# Patient Record
Sex: Male | Born: 1960 | Race: White | Hispanic: No | Marital: Married | Smoking: Never smoker
Health system: Southern US, Community
[De-identification: ages and names within clinical notes are randomized; demographics above are authoritative.]

## PROBLEM LIST (undated history)

## (undated) DIAGNOSIS — Z86010 Personal history of colonic polyps: Secondary | ICD-10-CM

## (undated) DIAGNOSIS — E785 Hyperlipidemia, unspecified: Secondary | ICD-10-CM

## (undated) DIAGNOSIS — I10 Essential (primary) hypertension: Secondary | ICD-10-CM

## (undated) DIAGNOSIS — N529 Male erectile dysfunction, unspecified: Secondary | ICD-10-CM

## (undated) HISTORY — PX: KNEE ARTHROSCOPY: SUR90

## (undated) HISTORY — DX: Male erectile dysfunction, unspecified: N52.9

## (undated) HISTORY — DX: Essential (primary) hypertension: I10

## (undated) HISTORY — DX: Personal history of colonic polyps: Z86.010

## (undated) HISTORY — DX: Hyperlipidemia, unspecified: E78.5

## (undated) HISTORY — PX: COLONOSCOPY: SHX174

---

## 1997-12-29 ENCOUNTER — Encounter: Payer: Self-pay | Admitting: Internal Medicine

## 2004-08-15 ENCOUNTER — Encounter: Payer: Self-pay | Admitting: Internal Medicine

## 2004-08-15 ENCOUNTER — Ambulatory Visit: Payer: Self-pay | Admitting: Internal Medicine

## 2004-08-20 ENCOUNTER — Ambulatory Visit: Payer: Self-pay | Admitting: Internal Medicine

## 2004-09-21 ENCOUNTER — Ambulatory Visit: Payer: Self-pay | Admitting: Internal Medicine

## 2004-12-03 DIAGNOSIS — Z86010 Personal history of colonic polyps: Secondary | ICD-10-CM

## 2004-12-03 DIAGNOSIS — Z860101 Personal history of adenomatous and serrated colon polyps: Secondary | ICD-10-CM

## 2004-12-03 HISTORY — DX: Personal history of colonic polyps: Z86.010

## 2004-12-03 HISTORY — DX: Personal history of adenomatous and serrated colon polyps: Z86.0101

## 2005-09-06 ENCOUNTER — Ambulatory Visit: Payer: Self-pay | Admitting: Internal Medicine

## 2007-01-05 ENCOUNTER — Ambulatory Visit: Payer: Self-pay | Admitting: Internal Medicine

## 2007-01-05 LAB — CONVERTED CEMR LAB
ALT: 23 units/L (ref 0–40)
Albumin: 3.9 g/dL (ref 3.5–5.2)
BUN: 10 mg/dL (ref 6–23)
Basophils Absolute: 0 10*3/uL (ref 0.0–0.1)
Basophils Relative: 0.7 % (ref 0.0–1.0)
Cholesterol: 212 mg/dL (ref 0–200)
Creatinine, Ser: 1 mg/dL (ref 0.4–1.5)
Direct LDL: 131.1 mg/dL
Eosinophils Absolute: 0.1 10*3/uL (ref 0.0–0.6)
Eosinophils Relative: 2 % (ref 0.0–5.0)
GFR calc Af Amer: 103 mL/min
HDL: 46.5 mg/dL (ref >39.0)
Leukocytes, UA: NEGATIVE
MCHC: 34.1 g/dL (ref 30.0–36.0)
MCV: 86.2 fL (ref 78.0–100.0)
Monocytes Absolute: 0.5 10*3/uL (ref 0.2–0.7)
Neutro Abs: 3.1 10*3/uL (ref 1.4–7.7)
Neutrophils Relative %: 52.2 % (ref 43.0–77.0)
PSA: 0.7 ng/mL (ref 0.10–4.00)
Platelets: 272 10*3/uL (ref 150–400)
Potassium: 4.8 meq/L (ref 3.5–5.1)
RDW: 12.2 % (ref 11.5–14.6)
Specific Gravity, Urine: 1.025 (ref 1.000–1.03)
Total CHOL/HDL Ratio: 4.6
Total Protein, Urine: NEGATIVE mg/dL
Total Protein: 7 g/dL (ref 6.0–8.3)
Urobilinogen, UA: 0.2 (ref 0.0–1.0)
VLDL: 15 mg/dL (ref 0–40)

## 2007-03-12 ENCOUNTER — Ambulatory Visit: Payer: Self-pay | Admitting: Internal Medicine

## 2007-03-12 DIAGNOSIS — I10 Essential (primary) hypertension: Secondary | ICD-10-CM

## 2007-03-12 DIAGNOSIS — E785 Hyperlipidemia, unspecified: Secondary | ICD-10-CM

## 2008-04-28 ENCOUNTER — Telehealth: Payer: Self-pay | Admitting: Internal Medicine

## 2008-05-16 ENCOUNTER — Ambulatory Visit: Payer: Self-pay | Admitting: Internal Medicine

## 2008-05-17 LAB — CONVERTED CEMR LAB
Chloride: 104 meq/L (ref 96–112)
GFR calc Af Amer: 103 mL/min
Sodium: 141 meq/L (ref 135–145)

## 2008-11-15 ENCOUNTER — Ambulatory Visit: Payer: Self-pay | Admitting: Internal Medicine

## 2008-11-15 DIAGNOSIS — N529 Male erectile dysfunction, unspecified: Secondary | ICD-10-CM

## 2008-12-28 ENCOUNTER — Ambulatory Visit: Payer: Self-pay | Admitting: Internal Medicine

## 2008-12-29 LAB — CONVERTED CEMR LAB
BUN: 12 mg/dL (ref 6–23)
Basophils Absolute: 0 10*3/uL (ref 0.0–0.1)
Basophils Relative: 0.3 % (ref 0.0–3.0)
CO2: 31 meq/L (ref 19–32)
Calcium: 9.1 mg/dL (ref 8.4–10.5)
Chloride: 98 meq/L (ref 96–112)
Creatinine, Ser: 1 mg/dL (ref 0.4–1.5)
Eosinophils Absolute: 0.1 10*3/uL (ref 0.0–0.7)
Lymphocytes Relative: 35.5 % (ref 12.0–46.0)
MCHC: 34.3 g/dL (ref 30.0–36.0)
MCV: 88.5 fL (ref 78.0–100.0)
Monocytes Absolute: 0.4 10*3/uL (ref 0.1–1.0)
Monocytes Relative: 7.4 % (ref 3.0–12.0)
Neutro Abs: 3.3 10*3/uL (ref 1.4–7.7)
Potassium: 4.6 meq/L (ref 3.5–5.1)
TSH: 1.98 microintl units/mL (ref 0.35–5.50)
WBC: 5.9 10*3/uL (ref 4.5–10.5)

## 2009-05-22 ENCOUNTER — Ambulatory Visit: Payer: Self-pay | Admitting: Internal Medicine

## 2009-09-08 ENCOUNTER — Encounter (INDEPENDENT_AMBULATORY_CARE_PROVIDER_SITE_OTHER): Payer: Self-pay | Admitting: *Deleted

## 2009-11-16 ENCOUNTER — Telehealth: Payer: Self-pay | Admitting: Family Medicine

## 2009-11-20 ENCOUNTER — Ambulatory Visit: Payer: Self-pay | Admitting: Internal Medicine

## 2009-11-21 ENCOUNTER — Telehealth: Payer: Self-pay | Admitting: Internal Medicine

## 2009-11-21 ENCOUNTER — Encounter (INDEPENDENT_AMBULATORY_CARE_PROVIDER_SITE_OTHER): Payer: Self-pay | Admitting: *Deleted

## 2009-11-22 LAB — CONVERTED CEMR LAB
ALT: 19 units/L (ref 0–53)
AST: 19 units/L (ref 0–37)
Alkaline Phosphatase: 59 units/L (ref 39–117)
BUN: 12 mg/dL (ref 6–23)
Basophils Absolute: 0 10*3/uL (ref 0.0–0.1)
Basophils Relative: 0.3 % (ref 0.0–3.0)
CO2: 31 meq/L (ref 19–32)
Chloride: 97 meq/L (ref 96–112)
Eosinophils Relative: 0.6 % (ref 0.0–5.0)
Glucose, Bld: 88 mg/dL (ref 70–99)
HDL: 44.9 mg/dL (ref >39.00)
Neutro Abs: 4.6 10*3/uL (ref 1.4–7.7)
Phosphorus: 3.5 mg/dL (ref 2.3–4.6)
Total Bilirubin: 1 mg/dL (ref 0.3–1.2)
Total Protein: 7 g/dL (ref 6.0–8.3)
Triglycerides: 107 mg/dL (ref 0.0–149.0)

## 2009-11-30 ENCOUNTER — Encounter (INDEPENDENT_AMBULATORY_CARE_PROVIDER_SITE_OTHER): Payer: Self-pay | Admitting: *Deleted

## 2009-12-04 ENCOUNTER — Ambulatory Visit: Payer: Self-pay | Admitting: Internal Medicine

## 2009-12-21 ENCOUNTER — Ambulatory Visit: Payer: Self-pay | Admitting: Internal Medicine

## 2009-12-21 LAB — HM COLONOSCOPY

## 2009-12-31 ENCOUNTER — Emergency Department (HOSPITAL_COMMUNITY): Admission: EM | Admit: 2009-12-31 | Discharge: 2009-12-31 | Payer: Self-pay | Admitting: Family Medicine

## 2010-06-13 ENCOUNTER — Ambulatory Visit: Payer: Self-pay | Admitting: Internal Medicine

## 2010-08-14 NOTE — Procedures (Signed)
Summary: Colonoscopy  Patient: Alex Mueller Note: All result statuses are Final unless otherwise noted.  Tests: (1) Colonoscopy (COL)   COL Colonoscopy           DONE     [facility]     [address]     [place], [place]  [address]           COLONOSCOPY PROCEDURE REPORT           PATIENT:  Alex, Mueller  MR#:  [id number]     BIRTHDATE:  1961/01/24, 49 yrs. old  GENDER:  male     ENDOSCOPIST:  Iva Boop, MD, [facility]           PROCEDURE DATE:  12/21/2009     PROCEDURE:  Colonoscopy 19147     ASA CLASS:  Class II     INDICATIONS:  surveillance and high-risk screening, history of     pre-cancerous (adenomatous) colon polyps, family history of colon     cancer 8 mm adenoma removed 2006     father had colon cancer diagnosed in early 70's     MEDICATIONS:   Fentanyl 75 mcg IV, Versed 8 mg IV           DESCRIPTION OF PROCEDURE:   After the risks benefits and     alternatives of the procedure were thoroughly explained, informed     consent was obtained.  Digital rectal exam was performed and     revealed no abnormalities and normal prostate.   The [id number]     endoscope was introduced through the anus and advanced to the     cecum, which was identified by both the appendix and ileocecal     valve, without limitations.  The quality of the prep was     excellent, using MoviPrep.  The instrument was then slowly     withdrawn as the colon was fully examined.     Insertion: 3:39 minutes Withdrawal: 10:20 minutes     <<PROCEDUREIMAGES>>           FINDINGS:  Scattered diverticula were found in the sigmoid colon.     This was otherwise a normal examination of the colon. This includes     right colon retroflexion.   Retroflexed views in the rectum     revealed internal hemorrhoids.    The scope was then withdrawn     from the patient and the procedure completed.           COMPLICATIONS:  None     ENDOSCOPIC IMPRESSION:     1) Diverticula, scattered in the sigmoid colon   2) Otherwise normal examination of the colon, excellent prep     3) Internal hemorrhoids in the rectum     4) Personal history of adenoma removal 2006 and father had colon     cancer (70's)           REPEAT EXAM:  In 5 year(s) for routine screening/surveillance     colonocsopy.           Iva Boop, MD, [facility]           CC:  The Patient           n.     eSIGNED:   Iva Boop at 12/21/2009 11:08 AM           Asencion Noble, [phone]  Note: An exclamation mark (!) indicates a result that was not dispersed into  the flowsheet. Document Creation Date: 12/21/2009 11:08 AM _______________________________________________________________________  (1) Order result status: Final Collection or observation date-time: 12/21/2009 11:00 Requested date-time:  Receipt date-time:  Reported date-time:  Referring Physician:   Ordering Physician: Stan Head [phone]) Specimen Source:  Source: Launa Grill Order Number: [id number] Lab site:   Appended Document: Colonoscopy    Clinical Lists Changes  Observations: Added new observation of COLONNXTDUE: 12/2014 (12/21/2009 11:27)

## 2010-08-14 NOTE — Letter (Signed)
Summary: Previsit letter  [facility]  [address] [place], [place] [address]   Phone: [phone]  Fax: [phone]       11/21/2009 MRN: [id number]  [facility] [address] [place], [place]  [address]  Dear Mr. Blumer,  Welcome to the Gastroenterology Division at [facility].    You are scheduled to see a nurse for your pre-procedure visit on 12-07-09 at 9am on the 3rd floor at [facility], [address]  We ask that you try to arrive at our office 15 minutes prior to your appointment time to allow for check-in.  Your nurse visit will consist of discussing your medical and surgical history, your immediate family medical history, and your medications.    Please bring a complete list of all your medications or, if you prefer, bring the medication bottles and we will list them.  We will need to be aware of both prescribed and over the counter drugs.  We will need to know exact dosage information as well.  If you are on blood thinners (Coumadin, Plavix, Aggrenox, Ticlid, etc.) please call our office today/prior to your appointment, as we need to consult with your physician about holding your medication.   Please be prepared to read and sign documents such as consent forms, a financial agreement, and acknowledgement forms.  If necessary, and with your consent, a friend or relative is welcome to sit-in on the nurse visit with you.  Please bring your insurance card so that we may make a copy of it.  If your insurance requires a referral to see a specialist, please bring your referral form from your primary care physician.  No co-pay is required for this nurse visit.     If you cannot keep your appointment, please call [phone] to cancel or reschedule prior to your appointment date.  This allows [place] the opportunity to schedule an appointment for another patient in need of care.    Thank you for choosing [facility] for your medical needs.  We  appreciate the opportunity to care for you.  Please visit [place] at our website  to learn more about our practice.                     Sincerely.                                                                                                                   The Gastroenterology Division

## 2010-08-14 NOTE — Assessment & Plan Note (Signed)
Summary: CPX/DLO   Vital Signs:  Patient profile:   50 year old male Weight:      233 pounds Temp:     98.3 degrees F oral Pulse rate:   64 / minute Pulse rhythm:   regular BP sitting:   112 / 60  (left arm) Cuff size:   large  Vitals Entered By: Mervin Hack CMA [facility]) (Nov 20, 2009 12:03 PM) CC: adult physical   History of Present Illness: Doing well No new concerns  Hasn't set up follow up colonoscopy It has been 5 years and he had a polyp    Allergies: 1)  ! Prinivil (Lisinopril)  Past History:  Past medical, surgical, family and social histories (including risk factors) reviewed for relevance to current acute and chronic problems.  Past Medical History: Reviewed history from 11/15/2008 and no changes required. Hypertension Hyperlipidemia Erectile dysfunction  Past Surgical History: Reviewed history from 03/12/2007 and no changes required. 1988 arthroscopy R knee 1998 arthroscopy L knee (Dr Priscille Kluver)  Family History: Reviewed history from 11/15/2008 and no changes required. Dad died of lung cancer @79 . Also had  bladder and colon cancer. CAD also Mom healthy 1 brother Dennie Bible uncle also with CAD No HTN  DM in Dad and Mat GM No prostate cancer  Social History: Reviewed history from 03/12/2007 and no changes required. 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 Eyes:  Denies double vision and vision loss-1 eye; needs reading glasses now. ENT:  Denies decreased hearing and ringing in ears; teeth okay--regular with dentist. CV:  Complains of lightheadness; denies chest pain or discomfort, difficulty breathing at night, difficulty breathing while lying down, fainting, and shortness of breath with exertion; gets some orthostatic dizziness when  first arising---goes away quickly. Resp:  Denies cough and shortness of breath. GI:  Denies abdominal pain, bloody stools, change in bowel habits, dark tarry stools, indigestion, nausea, and vomiting. GU:  Complains of erectile dysfunction; denies urinary frequency and urinary hesitancy; satisfied with viagra. Cialis was better discussed levitra-- $9 plan. MS:  Complains of joint pain; denies joint swelling; occ ankle pain. Derm:  Denies lesion(s) and rash. Neuro:  Denies headaches, numbness, and tingling. Psych:  Denies anxiety and depression. Heme:  Denies abnormal bruising and enlarge lymph nodes. Allergy:  Denies seasonal allergies and sneezing; had anaphylaxis with beesting in past and desensitization needs up dated epipen.  Physical Exam  General:  alert and normal appearance.   Eyes:  pupils equal, pupils round, pupils reactive to light, and no optic disk abnormalities.   Ears:  R ear normal and L ear normal.   Mouth:  no erythema and no lesions.   Neck:  supple, no masses, no thyromegaly, no carotid bruits, and no cervical lymphadenopathy.   Lungs:  normal respiratory effort and normal breath sounds.   Heart:  normal rate, regular rhythm, no murmur, and no gallop.   Abdomen:  soft, non-tender, and no masses.   Rectal:  no hemorrhoids and no masses.   Prostate:  no gland enlargement and no nodules.   Msk:  no joint tenderness and no joint swelling.   Pulses:  normal in feet Extremities:  no edema Neurologic:  alert & oriented X3, strength normal in all extremities, and gait normal.   Skin:  no rashes and no suspicious lesions.  Axillary Nodes:  No palpable lymphadenopathy Psych:  normally interactive, good eye contact, not anxious appearing, and not depressed appearing.     Impression & Recommendations:  Problem # 1:  PREVENTIVE HEALTH CARE (ICD-V70.0) Assessment Comment Only discussed fitness due for PSA needs appt for colon recall  Problem # 2:  HYPERTENSION  (ICD-401.9) Assessment: Unchanged  good control if orthostasis worsens, may need to decrease or try off meds  His updated medication list for this problem includes:    Hyzaar 100-25 Mg Tabs (Losartan potassium-hctz) .Marland Kitchen... 1 daily for high blood pressure  BP today: 112/60 Prior BP: 130/80 (05/22/2009)  Labs Reviewed: K+: 4.6 (12/28/2008) Creat: : 1.0 (12/28/2008)   Chol: 212 (01/05/2007)   HDL: 46.5 (01/05/2007)   LDL: DEL (01/05/2007)   TG: 73 (01/05/2007)  Orders: TLB-Renal Function Panel (80069-RENAL) TLB-CBC Platelet - w/Differential (85025-CBCD) TLB-TSH (Thyroid Stimulating Hormone) (84443-TSH) Venipuncture ([address])  Problem # 3:  HYPERLIPIDEMIA (ICD-272.4) Assessment: Comment Only  will recheck labs  Labs Reviewed: SGOT: 21 (01/05/2007)   SGPT: 23 (01/05/2007)   HDL:46.5 (01/05/2007)  LDL:DEL (01/05/2007)  Chol:212 (01/05/2007)  Trig:73 (01/05/2007)  Orders: TLB-Lipid Panel (80061-LIPID) TLB-Hepatic/Liver Function Pnl (80076-HEPATIC)  Complete Medication List: 1)  Adult Aspirin Low Strength 81 Mg Tbdp (Aspirin) .... Take 1 tablet by mouth once a day 2)  Viagra 100 Mg Tabs (Sildenafil citrate) .... 1/2 -1 about 30 minutes before sex as directed 3)  Hyzaar 100-25 Mg Tabs (Losartan potassium-hctz) .Marland Kitchen.. 1 daily for high blood pressure 4)  Levitra 20 Mg Tabs (Vardenafil hcl) .Marland Kitchen.. 1 tab about 30-60 minutes before sex 5)  Epipen 2-pak 0.3 Mg/0.41ml Devi (Epinephrine) .... Inject one for severe allergic reaction  Other Orders: TLB-PSA (Prostate Specific Antigen) (84153-PSA)  Patient Instructions: 1)  Please schedule a follow-up appointment in 6 months .  2)  Please call GI---he got recall letter for colonoscopy but never set it up Prescriptions: EPIPEN 2-PAK 0.3 MG/0.3ML DEVI (EPINEPHRINE) Inject one for severe allergic reaction  #1 x 1   Entered and Authorized by:   Cindee Salt MD   Signed by:   Cindee Salt MD on 11/20/2009   Method used:   Electronically  to        [facility]  [phone]* (retail)       [address]       [place], [place]  [address]       Ph: [phone] or [phone]       Fax: [phone]   RxID:   [id number] LEVITRA 20 MG TABS (VARDENAFIL HCL) 1 tab about 30-60 minutes before sex  #5 x 5   Entered and Authorized by:   Cindee Salt MD   Signed by:   Cindee Salt MD on 11/20/2009   Method used:   Electronically to        [facility]  [phone]* (retail)       [address]       [place], [place]  [address]       Ph: [phone] or [phone]       Fax: [phone]   RxID:   [id number] HYZAAR 100-25 MG TABS (LOSARTAN POTASSIUM-HCTZ) 1 daily for high blood pressure  #30 x 12   Entered by:   Mervin Hack CMA (AAMA)   Authorized by:   Cindee Salt MD   Signed by:   Mervin Hack CMA (  AAMA) on 11/20/2009   Method used:   [facility] [address] [phone]* (retail)       [address]       [place], [place]  [address]       Ph: [phone] or [phone]       Fax: [phone]   RxID:   [id number]   Current Allergies (reviewed today): ! PRINIVIL (LISINOPRIL)

## 2010-08-14 NOTE — Letter (Signed)
Summary: Colonoscopy Letter  [facility]  [address] [place], [place] [address]   Phone: [phone]  Fax: [phone]      September 08, 2009 MRN: [id number]   [facility] [address] [place], [place]  [address]   Dear Mr. [name],   According to your medical record, it is time for you to schedule a Colonoscopy. The American Cancer Society recommends this procedure as a method to detect early colon cancer. Patients with a family history of colon cancer, or a personal history of colon polyps or inflammatory bowel disease are at increased risk.  This letter has beeen generated based on the recommendations made at the time of your procedure. If you feel that in your particular situation this may no longer apply, please contact our office.  Please call our office at [phone] to schedule this appointment or to update your records at your earliest convenience.  Thank you for cooperating with [place] to provide you with the very best care possible.   Sincerely,  Iva Boop, M.D.  [facility]astroenter[facility] Division [phone]

## 2010-08-14 NOTE — Assessment & Plan Note (Signed)
Summary: 6 MONTH FOLLOW UP/[facility]H   Vital Signs:  Patient profile:   50 year old male Weight:      236 pounds BMI:     28.09 Temp:     98.3 degrees F oral Pulse rate:   60 / minute Pulse rhythm:   regular BP sitting:   128 / 70  (left arm) Cuff size:   large  Vitals Entered By: Mervin Hack CMA [facility]) (June 13, 2010 12:16 PM) CC: 6 month follow-up   History of Present Illness: Doing well Did have follow up colonoscopy--good for 5 years again   Still gets orthostatic dizziness if he gets up quick if he is squatting Never severe resolves after a few seconds No chest pain No SOB no edema  Allergies: 1)  ! Prinivil (Lisinopril)  Past History:  Past medical, surgical, family and social histories (including risk factors) reviewed for relevance to current acute and chronic problems.  Past Medical History: Reviewed history from 11/15/2008 and no changes required. Hypertension Hyperlipidemia Erectile dysfunction  Past Surgical History: Reviewed history from 03/12/2007 and no changes required. 1988 arthroscopy R knee 1998 arthroscopy L knee (Dr Priscille Kluver)  Family History: Reviewed history from 11/15/2008 and no changes required. Dad died of lung cancer @79 . Also had  bladder and colon cancer. CAD also Mom healthy 1 brother Dennie Bible uncle also with CAD No HTN  DM in Dad and Mat GM No prostate cancer  Social History: Reviewed history from 03/12/2007 and no changes required. 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   Neck:  supple, no masses, no thyromegaly, no carotid bruits, and no cervical lymphadenopathy.   Lungs:  normal respiratory effort, no intercostal retractions, no accessory muscle use, and normal breath sounds.     Heart:  normal rate, regular rhythm, no murmur, and no gallop.   Abdomen:  soft, non-tender, and no masses.   Msk:  no joint tenderness and no joint swelling.   Extremities:  no edema Psych:  normally interactive, good eye contact, not anxious appearing, and not depressed appearing.     Impression & Recommendations:  Problem # 1:  HYPERTENSION (ICD-401.9) Assessment Unchanged doing well no sig orthostasis so will continue the current dose  His updated medication list for this problem includes:    Hyzaar 100-25 Mg Tabs (Losartan potassium-hctz) .Marland Kitchen... 1 daily for high blood pressure  BP today: 128/70 Prior BP: 112/60 (11/20/2009)  Labs Reviewed: K+: 4.5 (11/20/2009) Creat: : 0.9 (11/20/2009)   Chol: 182 (11/20/2009)   HDL: 44.90 (11/20/2009)   LDL: 116 (11/20/2009)   TG: 107.0 (11/20/2009)  Problem # 2:  HYPERLIPIDEMIA (ICD-272.4) Assessment: Unchanged pretty good without Rx  Labs Reviewed: SGOT: 19 (11/20/2009)   SGPT: 19 (11/20/2009)   HDL:44.90 (11/20/2009), 46.5 (01/05/2007)  LDL:116 (11/20/2009), DEL (01/05/2007)  Chol:182 (11/20/2009), 212 (01/05/2007)  Trig:107.0 (11/20/2009), 73 (01/05/2007)  Complete Medication List: 1)  Adult Aspirin Low Strength 81 Mg Tbdp (Aspirin) .... Take 1 tablet by mouth once a day 2)  Viagra 100 Mg Tabs (Sildenafil citrate) .... 1/2 -1 about 30 minutes before sex as directed 3)  Hyzaar 100-25 Mg Tabs (Losartan potassium-hctz) .Marland Kitchen.. 1 daily for high blood pressure 4)  Levitra 20 Mg Tabs (Vardenafil hcl) .Marland Kitchen.. 1 tab about 30-60 minutes before  sex 5)  Epipen 2-pak 0.3 Mg/0.46ml Devi (Epinephrine) .... Inject one for severe allergic reaction  Patient Instructions: 1)  Please schedule a follow-up appointment in 6 months .    Orders Added: 1)  Est. Patient Level III [[address]]    Current Allergies (reviewed today): ! PRINIVIL (LISINOPRIL)

## 2010-08-14 NOTE — Progress Notes (Signed)
Summary: Colonscopic scheduled .Marland Kitchen...  Phone Note Outgoing Call   Call placed to: Patient Summary of Call: Colonscopic scheduled for June 9th w/ Dr. Lauraine Rinne pt to arrive at 9am.... Nurse visit scheduled for May 29th at 9am. Sp w/ pt aware of appt.Daine Gip  Nov 21, 2009 11:24 AM  Initial call taken by: Daine Gip,  Nov 21, 2009 11:24 AM  Follow-up for Phone Call        thanks Follow-up by: Cindee Salt MD,  Nov 21, 2009 1:05 PM

## 2010-08-14 NOTE — Miscellaneous (Signed)
Summary: [facility] Previsit/prep  Clinical Lists Changes  Medications: Added new medication of MOVIPREP 100 GM  SOLR (PEG-KCL-NACL-NASULF-NA ASC-C) As per prep instructions. - Signed Rx of MOVIPREP 100 GM  SOLR (PEG-KCL-NACL-NASULF-NA ASC-C) As per prep instructions.;  #1 x 0;  Signed;  Entered by: Wyona Almas RN;  Authorized by: Iva Boop MD, [facility];  Method [facility]: [facility] [address] [phone]*, [address], [place], [place], [place]  [address], Ph: [phone] or [phone], Fax: [phone] Observations: Added new observation of ALLERGY REV: Done (12/04/2009 8:52)    Prescriptions: MOVIPREP 100 GM  SOLR (PEG-KCL-NACL-NASULF-NA ASC-C) As per prep instructions.  #1 x 0   Entered by:   Wyona Almas RN   Authorized by:   Iva Boop MD, [facility]   Signed by:   Wyona Almas RN on 12/04/2009   Method used:   Electronically to        [facility] [address] [phone]* (retail)       [address]       [place], [place]  [address]       Ph: [phone] or [phone]       Fax: [phone]   RxID:   [id number]

## 2010-08-14 NOTE — Letter (Signed)
Summary: [facility] Instructions  [facility]  [address] [place], [place] [address]   Phone: [phone]  Fax: [phone]       Alex Mueller    1961/07/03    MRN: [id number]        Procedure Day /Date:  12/21/09   Thursday     Arrival Time:   9:00am      Procedure Time:  10:00am     Location of Procedure:                    _x _  [facility] (4th Floor)                        PREPARATION FOR COLONOSCOPY WITH MOVIPREP   Starting 5 days prior to your procedure _ 6/4/11_ do not eat nuts, seeds, popcorn, corn, beans, peas,  salads, or any raw vegetables.  Do not take any fiber supplements (e.g. Metamucil, Citrucel, and Benefiber).  THE DAY BEFORE YOUR PROCEDURE         DATE:  12/20/09  DAY:  Wednesday  1.  Drink clear liquids the entire day-NO SOLID FOOD  2.  Do not drink anything colored red or purple.  Avoid juices with pulp.  No orange juice.  3.  Drink at least 64 oz. (8 glasses) of fluid/clear liquids during the day to prevent dehydration and help the prep work efficiently.  CLEAR LIQUIDS INCLUDE: Water Jello Ice Popsicles Tea (sugar ok, no milk/cream) Powdered fruit flavored drinks Coffee (sugar ok, no milk/cream) Gatorade Juice: apple, white grape, white cranberry  Lemonade Clear bullion, consomm, broth Carbonated beverages (any kind) Strained chicken noodle soup Hard Candy                             4.  In the morning, mix first dose of MoviPrep solution:    Empty 1 Pouch A and 1 Pouch B into the disposable container    Add lukewarm drinking water to the top line of the container. Mix to dissolve    Refrigerate (mixed solution should be used within 24 hrs)  5.  Begin drinking the prep at 5:00 p.m. The MoviPrep container is divided by 4 marks.   Every 15 minutes drink the solution down to the next mark (approximately 8 oz) until the full liter is complete.   6.  Follow completed prep with 16 oz of clear liquid of your choice  (Nothing red or purple).  Continue to drink clear liquids until bedtime.  7.  Before going to bed, mix second dose of MoviPrep solution:    Empty 1 Pouch A and 1 Pouch B into the disposable container    Add lukewarm drinking water to the top line of the container. Mix to dissolve    Refrigerate  THE DAY OF YOUR PROCEDURE      DATE:  12/21/09  DAY:  Thursday  Beginning at 5:00am  (5 hours before procedure):         1. Every 15 minutes, drink the solution down to the next mark (approx 8 oz) until the full liter is complete.  2. Follow completed prep with 16 oz. of clear liquid of your choice.    3. You may drink clear liquids until   8:00am (2 HOURS BEFORE PROCEDURE).   MEDICATION INSTRUCTIONS  Unless otherwise instructed, you should take regular prescription medications with a small  sip of water   as early as possible the morning of your procedure.   Additional medication instructions: Hold Hyzaar the morning of procedure.         OTHER INSTRUCTIONS  You will need a responsible adult at least 51 years of age to accompany you and drive you home.   This person must remain in the waiting room during your procedure.  Wear loose fitting clothing that is easily removed.  Leave jewelry and other valuables at home.  However, you may wish to bring a book to read or  an iPod/MP3 player to listen to music as you wait for your procedure to start.  Remove all body piercing jewelry and leave at home.  Total time from sign-in until discharge is approximately 2-3 hours.  You should go home directly after your procedure and rest.  You can resume normal activities the  day after your procedure.  The day of your procedure you should not:   Drive   Make legal decisions   Operate machinery   Drink alcohol   Return to work  You will receive specific instructions about eating, activities and medications before you leave.    The above instructions have been reviewed and  explained to me by   Wyona Almas RN  Dec 04, 2009 9:21 AM     I fully understand and can verbalize these instructions _____________________________ Date _________

## 2010-08-14 NOTE — Progress Notes (Signed)
Summary: refill request for viagra  Phone Note Refill Request   Refills Requested: Medication #1:  VIAGRA 100 MG TABS 1/2 -1 about 30 minutes before sex as directed   Last Refilled: 11/15/2008 [facility]  Initial call taken by: Lowella Petties CMA,  Nov 16, 2009 8:17 AM    Prescriptions: VIAGRA 100 MG TABS (SILDENAFIL CITRATE) 1/2 -1 about 30 minutes before sex as directed  #6 x 5   Entered and Authorized by:   Ruthe Mannan MD   Signed by:   Ruthe Mannan MD on 11/16/2009   Method used:   [facility]        [facility] [address] [phone]* (retail)       [address]       [place], [place]  [address]       Ph: [phone] or [phone]       Fax: [phone]   RxID:   [id number]

## 2010-09-30 LAB — DIFFERENTIAL
Basophils Absolute: 0 10*3/uL (ref 0.0–0.1)
Eosinophils Absolute: 0 10*3/uL (ref 0.0–0.7)
Lymphs Abs: 1 10*3/uL (ref 0.7–4.0)
Monocytes Absolute: 0.5 10*3/uL (ref 0.1–1.0)
Neutro Abs: 7.2 10*3/uL (ref 1.7–7.7)

## 2010-09-30 LAB — CBC
HCT: 45.2 % (ref 39.0–52.0)
Hemoglobin: 15.6 g/dL (ref 13.0–17.0)
MCHC: 34.4 g/dL (ref 30.0–36.0)
MCV: 87.9 fL (ref 78.0–100.0)
Platelets: 197 10*3/uL (ref 150–400)
RDW: 13.4 % (ref 11.5–15.5)

## 2010-09-30 LAB — POCT I-STAT, CHEM 8
HCT: 48 % (ref 39.0–52.0)
Sodium: 137 mEq/L (ref 135–145)

## 2010-11-27 NOTE — Assessment & Plan Note (Signed)
[facility]                                 ON-CALL NOTE   NAME:Alex Mueller, Alex Mueller                          MRN:          [id number]  DATE:12/13/2006                            DOB:          September 03, 1960    TIME RECEIVED:  Is 8:49 a.m.   CALLER:  Asencion Noble.   He sees Dr. Jonny Ruiz.   TELEPHONE NUMBER:  Is [phone].   The patient has run out of his blood pressure medication.  He will be  leaving town later today and needs a small supply called in for him.  He  is on Diovan/HCT 320/25 to take one a day.  I did call in #30 of these  with no refills to the [facility] at [phone].     Tera Mater. Clent Ridges, MD  Electronically Signed    SAF/MedQ  DD: 12/13/2006  DT: 12/13/2006  Job #: [id number]

## 2010-12-05 ENCOUNTER — Encounter: Payer: Self-pay | Admitting: Internal Medicine

## 2010-12-14 ENCOUNTER — Other Ambulatory Visit: Payer: Self-pay | Admitting: *Deleted

## 2010-12-14 MED ORDER — LOSARTAN POTASSIUM-HCTZ 100-25 MG PO TABS: 1.0000 | ORAL_TABLET | Freq: Every day | ORAL | Status: DC

## 2010-12-18 ENCOUNTER — Encounter: Payer: Self-pay | Admitting: Internal Medicine

## 2010-12-19 ENCOUNTER — Ambulatory Visit (INDEPENDENT_AMBULATORY_CARE_PROVIDER_SITE_OTHER): Payer: BC Managed Care – PPO | Admitting: Internal Medicine

## 2010-12-19 ENCOUNTER — Encounter: Payer: Self-pay | Admitting: Internal Medicine

## 2010-12-19 DIAGNOSIS — I10 Essential (primary) hypertension: Secondary | ICD-10-CM

## 2010-12-19 DIAGNOSIS — Z0000 Encounter for general adult medical examination without abnormal findings: Secondary | ICD-10-CM

## 2010-12-19 DIAGNOSIS — N529 Male erectile dysfunction, unspecified: Secondary | ICD-10-CM

## 2010-12-19 DIAGNOSIS — E785 Hyperlipidemia, unspecified: Secondary | ICD-10-CM

## 2010-12-19 LAB — CBC WITH DIFFERENTIAL/PLATELET
Eosinophils Absolute: 0.1 10*3/uL (ref 0.0–0.7)
Eosinophils Relative: 1.1 % (ref 0.0–5.0)
HCT: 46.3 % (ref 39.0–52.0)
Lymphocytes Relative: 39.8 % (ref 12.0–46.0)
Lymphs Abs: 2.4 10*3/uL (ref 0.7–4.0)
Neutro Abs: 3.1 10*3/uL (ref 1.4–7.7)
Platelets: 253 10*3/uL (ref 150.0–400.0)
RBC: 5.28 Mil/uL (ref 4.22–5.81)
WBC: 6.1 10*3/uL (ref 4.5–10.5)

## 2010-12-19 LAB — BASIC METABOLIC PANEL
BUN: 14 mg/dL (ref 6–23)
Calcium: 9.5 mg/dL (ref 8.4–10.5)
Creatinine, Ser: 1 mg/dL (ref 0.4–1.5)

## 2010-12-19 LAB — HEPATIC FUNCTION PANEL
Albumin: 4.5 g/dL (ref 3.5–5.2)
Alkaline Phosphatase: 69 U/L (ref 39–117)

## 2010-12-19 LAB — LIPID PANEL
Cholesterol: 193 mg/dL (ref 0–200)
HDL: 43.5 mg/dL (ref >39.00)
Triglycerides: 90 mg/dL (ref 0.0–149.0)
VLDL: 18 mg/dL (ref 0.0–40.0)

## 2010-12-19 LAB — TSH: TSH: 2.13 u[IU]/mL (ref 0.35–5.50)

## 2010-12-19 MED ORDER — EPINEPHRINE 0.3 MG/0.3ML IJ DEVI: 0.3000 mg | Freq: Once | INTRAMUSCULAR | Status: DC

## 2010-12-19 MED ORDER — VARDENAFIL HCL 20 MG PO TABS: 20.0000 mg | ORAL_TABLET | Freq: Every day | ORAL | Status: DC | PRN

## 2010-12-19 MED ORDER — LOSARTAN POTASSIUM-HCTZ 100-25 MG PO TABS: 1.0000 | ORAL_TABLET | Freq: Every day | ORAL | Status: DC

## 2010-12-19 NOTE — Assessment & Plan Note (Signed)
Lab Results  Component Value Date   LDLCALC 116* 11/20/2009   Will recheck No meds for now

## 2010-12-19 NOTE — Assessment & Plan Note (Signed)
BP Readings from Last 3 Encounters:  12/19/10 138/80  06/13/10 128/70  11/20/09 112/60   Good control Will check labs

## 2010-12-19 NOTE — Progress Notes (Signed)
Subjective:    Patient ID: Alex Mueller, male    DOB: 13-Jun-1961, 50 y.o.   MRN: [id number]  HPI Doing well No new concerns  Discussed PSA--will proceed  Satisfied with levitra  Current Outpatient Prescriptions on File Prior to Visit  Medication Sig Dispense Refill  . aspirin 81 MG tablet Take 81 mg by mouth daily.        Marland Kitchen DISCONTD: EPINEPHrine (EPIPEN) 0.3 mg/0.3 mL DEVI Inject 0.3 mg into the muscle once.        Marland Kitchen DISCONTD: losartan-hydrochlorothiazide (HYZAAR) 100-25 MG per tablet Take 1 tablet by mouth daily.  30 tablet  0  . DISCONTD: vardenafil (LEVITRA) 20 MG tablet Take 20 mg by mouth daily as needed.        Marland Kitchen DISCONTD: sildenafil (VIAGRA) 100 MG tablet Take 100 mg by mouth daily as needed.          Past Medical History  Diagnosis Date  . Hypertension   . Hyperlipidemia   . ED (erectile dysfunction)     Past Surgical History  Procedure Date  . Knee arthroscopy     right knee 1988, left knee 1998    Family History  Problem Relation Age of Onset  . Healthy Mother   . Coronary artery disease Father   . Cancer Father     bladder and colon cancer  . Diabetes Father   . Coronary artery disease Paternal Uncle   . Drug abuse Maternal Grandmother   . Hypertension Neg Hx     History   Social History  . Marital Status: Married    Spouse Name: N/A    Number of Children: 2  . 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 Smoking status: Never Smoker   . Smokeless tobacco: Never Used  . Alcohol Use: No  . Drug Use: No  . Sexually Active: Not on file   Other Topics Concern  . Not on file   Social History Narrative  . No narrative on file   Review of Systems  Constitutional: Negative for fatigue and unexpected weight change.  HENT: Positive for tinnitus. Negative for hearing loss, congestion, rhinorrhea and dental problem.        Occ "crinkling" in left ear occ nausea  like vertigo if he is in certain positions Keeps up with dentist  Eyes: Negative for visual disturbance.       No diplopia or focal vision loss  Respiratory: Negative for cough, chest tightness and shortness of breath.   Cardiovascular: Negative for chest pain, palpitations and leg swelling.  Gastrointestinal: Negative for nausea, vomiting, constipation and blood in stool.       No heartburn  Genitourinary: Negative for dysuria, urgency, decreased urine volume and difficulty urinating.  Musculoskeletal: Positive for back pain. Negative for joint swelling and arthralgias.       Occ AM back pain---better quickly  Skin: Negative for rash.       No suspicious lesions  Neurological: Negative for dizziness, syncope, weakness, light-headedness and headaches.  Hematological: Negative for adenopathy. Does not bruise/bleed easily.  Psychiatric/Behavioral: Negative for sleep disturbance and dysphoric mood. The patient is not nervous/anxious.        Some AM grogginess--goes away quickly Seems to sleep well       Objective:   Physical Exam  Constitutional: He is oriented to person, place, and time. He appears well-developed and well-nourished. No distress.  HENT:  Head: Normocephalic and atraumatic.  Right Ear: External ear normal.  Left Ear: External ear normal.  Mouth/Throat: Oropharynx is clear and moist. No oropharyngeal exudate.       TMs normal  Eyes: Conjunctivae and EOM are normal. Pupils are equal, round, and reactive to light.       Fundi benign  Neck: Normal range of motion. Neck supple. No thyromegaly present.  Cardiovascular: Normal rate, regular rhythm, normal heart sounds and intact distal pulses.  Exam reveals no gallop.   No murmur heard. Pulmonary/Chest: Effort normal and breath sounds normal. No respiratory distress. He has no wheezes. He has no rales.  Abdominal: Soft. He exhibits no mass. There is no tenderness.  Musculoskeletal: Normal range of motion. He exhibits no  edema and no tenderness.  Lymphadenopathy:    He has no cervical adenopathy.  Neurological: He is alert and oriented to person, place, and time. He exhibits normal muscle tone.       Normal strength and gait  Skin: Skin is warm. No rash noted.  Psychiatric: He has a normal mood and affect. His behavior is normal. Judgment and thought content normal.          Assessment & Plan:

## 2010-12-19 NOTE — Assessment & Plan Note (Signed)
Happy with the levitra

## 2010-12-19 NOTE — Assessment & Plan Note (Signed)
Doing well Discussed fitness Will check PSA

## 2010-12-21 LAB — PSA: PSA: 0.73 ng/mL (ref 0.10–4.00)

## 2011-06-10 ENCOUNTER — Encounter: Payer: Self-pay | Admitting: Internal Medicine

## 2011-06-10 ENCOUNTER — Ambulatory Visit (INDEPENDENT_AMBULATORY_CARE_PROVIDER_SITE_OTHER): Payer: BC Managed Care – PPO | Admitting: Internal Medicine

## 2011-06-10 ENCOUNTER — Telehealth: Payer: Self-pay | Admitting: *Deleted

## 2011-06-10 VITALS — BP 145/85 | HR 78 | Temp 98.0°F | Ht 77.0 in | Wt 232.0 lb

## 2011-06-10 DIAGNOSIS — F430 Acute stress reaction: Secondary | ICD-10-CM

## 2011-06-10 MED ORDER — ALPRAZOLAM 0.25 MG PO TABS: 0.2500 mg | ORAL_TABLET | Freq: Two times a day (BID) | ORAL | Status: AC | PRN

## 2011-06-10 NOTE — Assessment & Plan Note (Signed)
Having anxiety spells with some panic features No preexisting depression or anxiety counseled Will give Rx for alprazolam for prn use

## 2011-06-10 NOTE — Progress Notes (Signed)
  Subjective:    Patient ID: Alex Mueller, male    DOB: 1961-06-05, 50 y.o.   MRN: [id number]  HPI Had a lot of stress over the past week Daughter got confused and was evaluated in [facility] Was admitted to Behavioral health Diagnosed with bipolar disorder  Had sense of overwhelming anxiety while waiting to visit this weekend Heart was racing, hands felt clammy  Had overwhelming sense of fear Did resolve and then recurred several times 11/24 Wonders about something for these nerves  No sense of impending death Just "overwhelming fear"  No depression No regular anxiety  Current Outpatient Prescriptions on File Prior to Visit  Medication Sig Dispense Refill  . aspirin 81 MG tablet Take 81 mg by mouth daily.        Marland Kitchen EPINEPHrine (EPIPEN) 0.3 mg/0.3 mL DEVI Inject 0.3 mLs (0.3 mg total) into the muscle once.  1 Device  1  . losartan-hydrochlorothiazide (HYZAAR) 100-25 MG per tablet Take 1 tablet by mouth daily.  30 tablet  11  . vardenafil (LEVITRA) 20 MG tablet Take 1 tablet (20 mg total) by mouth daily as needed.  10 tablet  3    Allergies  Allergen Reactions  . Lisinopril     REACTION: cough    Past Medical History  Diagnosis Date  . Hypertension   . Hyperlipidemia   . ED (erectile dysfunction)     Past Surgical History  Procedure Date  . Knee arthroscopy     right knee 1988, left knee 1998    Family History  Problem Relation Age of Onset  . Healthy Mother   . Coronary artery disease Father   . Cancer Father     bladder and colon cancer  . Diabetes Father   . Coronary artery disease Paternal Uncle   . Drug abuse Maternal Grandmother   . Hypertension Neg Hx     History   Social History  . Marital Status: Married    Spouse Name: N/A    Number of Children: 2  . 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 Smoking status: Never Smoker   . Smokeless tobacco: Never  Used  . Alcohol Use: No  . Drug Use: No  . Sexually Active: Not on file   Other Topics Concern  . Not on file   Social History Narrative  . No narrative on file   Review of Systems Sleeping okay until the past couple of nights Appetite is off just the past couple of days---fine before this stress with daughter    Objective:   Physical Exam  Constitutional: He appears well-developed and well-nourished. No distress.  Psychiatric: He has a normal mood and affect. His behavior is normal. Judgment and thought content normal.          Assessment & Plan:

## 2011-06-10 NOTE — Telephone Encounter (Signed)
Pt states he is under a lot of stress, his daughter was admitted to the hospital this week end.  He would like something for anxiety.  Appt scheduled for this morning.

## 2011-06-11 ENCOUNTER — Telehealth: Payer: Self-pay | Admitting: Internal Medicine

## 2011-06-11 NOTE — Telephone Encounter (Signed)
Was seen yesterday and issues reviewed

## 2011-06-11 NOTE — Telephone Encounter (Signed)
Patient Name: Alex Mueller Call Date & Time: 06/09/2011 6:40:19AM Patient Phone: [phone] PCP: Tillman Abide Patient Gender: Male PCP Fax : [phone] Patient DOB: [birth year] Practice Name: [facility] Reason for Call: Caller: Koltin/Patient; PCP: Tillman Abide I.; CB#: [phone]; Call Reason: Other; Sx Onset: 06/09/2011; Sx Notes: ; Afebrile; ; ; Guideline Used: ; Disp:; Appt Scheduled?: Calling to get a RX for something for anxiety. Has had a lot of personal issues, daughter is in hospital. He hasn't been able to rest. He has been feeling overwhelmed and unable to calm down. See in 24 hr disposition. Protocol(s) Used: Anxiety Recommended Outcome per Protocol: See Provider within 24 hours Reason for Outcome: Feeling overwhelmed AND unable to calm down Care Advice: ~ Should not be alone, arrange for support (family member, friend, etc.). Distraction through talking with friends, listening to music, writing, going for a walk or ride can help relieve symptoms. ~ Avoid the use of stimulants including caffeine (coffee, some soft drinks, some energy drinks, tea and chocolate), cocaine, and amphetamines. Also avoid drinking alcohol. ~ Avoid using drugs and alcohol for relaxation. Instead of relaxing you, they may cause more anxiety and may contribute to other problems. Try to focus on finding solutions for your current situation. ~ ~ SYMPTOM / CONDITION MANAGEMENT 11/

## 2011-06-21 ENCOUNTER — Ambulatory Visit: Payer: BC Managed Care – PPO | Admitting: Internal Medicine

## 2011-07-12 ENCOUNTER — Telehealth: Payer: Self-pay | Admitting: Internal Medicine

## 2011-07-12 NOTE — Telephone Encounter (Signed)
Patient scheduled appt at the Saturday clinic at 9am, advised pt to call if not any better.

## 2011-07-12 NOTE — Telephone Encounter (Signed)
Patient called and stated he is having fever in the morning, runny nose, non-productive cough and ear clogged up.  Patient would like to know what he can do for this.  He stated he has been taking Advil q6hrs and it reduces the fever.  Wanted to know if he should come in or take something OTC.

## 2011-07-12 NOTE — Telephone Encounter (Signed)
.  left message to have patient return my call.  

## 2011-07-12 NOTE — Telephone Encounter (Signed)
If he has been sick just a few days and is not having SOB, inability to eat, or high fever----he probably needs to just wait this out If he has been sick more that a week and not improving, or has SOB, he should be seen

## 2011-07-12 NOTE — Telephone Encounter (Signed)
okay

## 2011-07-13 ENCOUNTER — Encounter: Payer: Self-pay | Admitting: Family Medicine

## 2011-07-13 ENCOUNTER — Ambulatory Visit (INDEPENDENT_AMBULATORY_CARE_PROVIDER_SITE_OTHER): Payer: BC Managed Care – PPO | Admitting: Family Medicine

## 2011-07-13 DIAGNOSIS — R6889 Other general symptoms and signs: Secondary | ICD-10-CM

## 2011-07-13 NOTE — Patient Instructions (Signed)
You have the flu. Drink plenty of fluids and stay home. Call if still having a fever on Monday.

## 2011-07-13 NOTE — Progress Notes (Signed)
  Subjective:    Patient ID: Alex Mueller, male    DOB: [birth year], 50 y.o.   MRN: [id number]  HPI Sick for 3 days with fever, productive cough, runny nose, ear pressure, chills, bodyaches in his legs and arms.  Fever today.  No known flu exposure. Wife has had a cold.  DIdn't get flu shot this year. Usind Advil for fever reducer. No GI sxs.    Review of Systems     Objective:   Physical Exam  Constitutional: He is oriented to person, place, and time. He appears well-developed and well-nourished.  HENT:  Head: Normocephalic and atraumatic.  Right Ear: External ear normal.  Left Ear: External ear normal.  Nose: Nose normal.  Mouth/Throat: Oropharynx is clear and moist.       TMs and canals are clear.   Eyes: Conjunctivae and EOM are normal. Pupils are equal, round, and reactive to light.  Neck: Neck supple. No thyromegaly present.  Cardiovascular: Normal rate and normal heart sounds.   Pulmonary/Chest: Effort normal and breath sounds normal.  Lymphadenopathy:    He has no cervical adenopathy.  Neurological: He is alert and oriented to person, place, and time.  Skin: Skin is warm and dry.  Psychiatric: He has a normal mood and affect.          Assessment & Plan:  Flu or flu-like syndrome- REst, hydration, fever reducer. Call if still having fever by Monday.  Stay home avoid going out in public.

## 2011-07-24 ENCOUNTER — Ambulatory Visit (INDEPENDENT_AMBULATORY_CARE_PROVIDER_SITE_OTHER): Payer: BC Managed Care – PPO | Admitting: Internal Medicine

## 2011-07-24 ENCOUNTER — Encounter: Payer: Self-pay | Admitting: Internal Medicine

## 2011-07-24 DIAGNOSIS — Z23 Encounter for immunization: Secondary | ICD-10-CM

## 2011-07-24 DIAGNOSIS — F430 Acute stress reaction: Secondary | ICD-10-CM

## 2011-07-24 DIAGNOSIS — E785 Hyperlipidemia, unspecified: Secondary | ICD-10-CM

## 2011-07-24 DIAGNOSIS — I10 Essential (primary) hypertension: Secondary | ICD-10-CM

## 2011-07-24 NOTE — Assessment & Plan Note (Signed)
BP Readings from Last 3 Encounters:  07/24/11 138/76  07/13/11 128/88  06/10/11 145/85   Good control No changes needed

## 2011-07-24 NOTE — Progress Notes (Signed)
  Subjective:    Patient ID: Alex Mueller, male    DOB: 1960-07-19, 51 y.o.   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 aspirin 81 MG tablet Take 81 mg by mouth daily.        Marland Kitchen EPINEPHrine (EPIPEN) 0.3 mg/0.3 mL DEVI Inject 0.3 mLs (0.3 mg total) into the muscle once.  1 Device  1  . losartan-hydrochlorothiazide (HYZAAR) 100-25 MG per tablet Take 1 tablet by mouth daily.  30 tablet  11  . vardenafil (LEVITRA) 20 MG tablet Take 1 tablet (20 mg total) by mouth daily as needed.  10 tablet  3    Allergies  Allergen Reactions  . Lisinopril     REACTION: cough    Past Medical History  Diagnosis Date  . Hypertension   . Hyperlipidemia   . ED (erectile dysfunction)     Past Surgical History  Procedure Date  . Knee arthroscopy     right knee 1988, left knee 1998    Family History  Problem Relation Age of Onset  . Healthy Mother   . Coronary artery disease Father   . Cancer Father     bladder and colon cancer  . Diabetes Father   . Coronary artery disease Paternal Uncle   . Drug abuse Maternal Grandmother   . Hypertension Neg Hx     History   Social History  . Marital Status: Married    Spouse Name: N/A    Number of Children: 2  . 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 Smoking status: Never Smoker   . Smokeless tobacco: Never Used  . Alcohol Use: No  . Drug Use: No  . Sexually Active: Not on file   Other Topics Concern  . Not on file   Social History Narrative  . No narrative on file   Review of Systems Sleeps well Appetite is good Weight  stable    Objective:   Physical Exam  Constitutional: He appears well-developed and well-nourished. No distress.  Neck: Normal range of motion. Neck supple. No thyromegaly present.  Cardiovascular: Normal rate, regular rhythm and normal heart sounds.  Exam reveals no gallop.   No murmur heard. Pulmonary/Chest: Effort normal and breath sounds normal. No respiratory distress. He has no wheezes. He has no rales.  Musculoskeletal: He exhibits no edema and no tenderness.  Lymphadenopathy:    He has no cervical adenopathy.  Psychiatric: He has a normal mood and affect. His behavior is normal. Judgment and thought content normal.          Assessment & Plan:

## 2011-07-24 NOTE — Assessment & Plan Note (Signed)
Discussed rationale for primary prevention Given total under 200 and LDL only 132---he wants to hold off on meds

## 2011-07-24 NOTE — Assessment & Plan Note (Signed)
improved daughter is doing better now Didn't need the alprazolam

## 2011-11-20 ENCOUNTER — Other Ambulatory Visit: Payer: Self-pay | Admitting: *Deleted

## 2011-11-20 MED ORDER — LOSARTAN POTASSIUM-HCTZ 100-25 MG PO TABS: 1.0000 | ORAL_TABLET | Freq: Every day | ORAL | Status: DC

## 2012-01-28 ENCOUNTER — Encounter: Payer: Self-pay | Admitting: Internal Medicine

## 2012-01-28 ENCOUNTER — Ambulatory Visit (INDEPENDENT_AMBULATORY_CARE_PROVIDER_SITE_OTHER): Payer: BC Managed Care – PPO | Admitting: Internal Medicine

## 2012-01-28 VITALS — BP 120/80 | HR 58 | Temp 98.4°F | Ht 77.0 in | Wt 238.0 lb

## 2012-01-28 DIAGNOSIS — E785 Hyperlipidemia, unspecified: Secondary | ICD-10-CM

## 2012-01-28 DIAGNOSIS — I10 Essential (primary) hypertension: Secondary | ICD-10-CM

## 2012-01-28 DIAGNOSIS — Z0000 Encounter for general adult medical examination without abnormal findings: Secondary | ICD-10-CM

## 2012-01-28 LAB — HEPATIC FUNCTION PANEL
AST: 16 U/L (ref 0–37)
Alkaline Phosphatase: 50 U/L (ref 39–117)

## 2012-01-28 LAB — CBC WITH DIFFERENTIAL/PLATELET
Basophils Absolute: 0 K/uL (ref 0.0–0.1)
Basophils Relative: 0.6 % (ref 0.0–3.0)
Eosinophils Absolute: 0.1 K/uL (ref 0.0–0.7)
Eosinophils Relative: 1.3 % (ref 0.0–5.0)
HCT: 46.3 % (ref 39.0–52.0)
Hemoglobin: 15.4 g/dL (ref 13.0–17.0)
Lymphocytes Relative: 34.9 % (ref 12.0–46.0)
Lymphs Abs: 2.3 K/uL (ref 0.7–4.0)
MCHC: 33.3 g/dL (ref 30.0–36.0)
MCV: 88.8 fl (ref 78.0–100.0)
Monocytes Absolute: 0.6 K/uL (ref 0.1–1.0)
Monocytes Relative: 8.3 % (ref 3.0–12.0)
Neutro Abs: 3.6 K/uL (ref 1.4–7.7)
Neutrophils Relative %: 54.9 % (ref 43.0–77.0)
Platelets: 227 K/uL (ref 150.0–400.0)
RBC: 5.21 Mil/uL (ref 4.22–5.81)
RDW: 13.5 % (ref 11.5–14.6)
WBC: 6.6 K/uL (ref 4.5–10.5)

## 2012-01-28 LAB — BASIC METABOLIC PANEL WITH GFR
BUN: 18 mg/dL (ref 6–23)
CO2: 28 meq/L (ref 19–32)
Calcium: 9.2 mg/dL (ref 8.4–10.5)
Chloride: 104 meq/L (ref 96–112)
Creatinine, Ser: 1 mg/dL (ref 0.4–1.5)
GFR: 82.36 mL/min
Glucose, Bld: 94 mg/dL (ref 70–99)
Potassium: 4.6 meq/L (ref 3.5–5.1)
Sodium: 139 meq/L (ref 135–145)

## 2012-01-28 LAB — LIPID PANEL
HDL: 46.5 mg/dL (ref >39.00)
Total CHOL/HDL Ratio: 4
Triglycerides: 91 mg/dL (ref 0.0–149.0)

## 2012-01-28 NOTE — Assessment & Plan Note (Signed)
BP Readings from Last 3 Encounters:  01/28/12 120/80  07/24/11 138/76  07/13/11 128/88   Good control No changes needed Due for labs

## 2012-01-28 NOTE — Assessment & Plan Note (Signed)
Healthy but can do a better job on fitness No PSA this year--was under 1 last year. Will reconsider next year

## 2012-01-28 NOTE — Assessment & Plan Note (Signed)
Will work on fitness No meds for primary prevention at this point

## 2012-01-28 NOTE — Progress Notes (Signed)
Subjective:    Patient ID: Alex Mueller, male    DOB: Oct 22, 1960, 51 y.o.   MRN: [id number]  HPI Doing well No stress of note---other than daughter getting married last week  Stays active at work Not consistent with walking Discussed fitness  No problems with meds Current Outpatient Prescriptions on File Prior to Visit  Medication Sig Dispense Refill  . aspirin 81 MG tablet Take 81 mg by mouth daily.        Marland Kitchen EPINEPHrine (EPIPEN) 0.3 mg/0.3 mL DEVI Inject 0.3 mLs (0.3 mg total) into the muscle once.  1 Device  1  . losartan-hydrochlorothiazide (HYZAAR) 100-25 MG per tablet Take 1 tablet by mouth daily.  90 tablet  3  . vardenafil (LEVITRA) 20 MG tablet Take 1 tablet (20 mg total) by mouth daily as needed.  10 tablet  3    Allergies  Allergen Reactions  . Lisinopril     REACTION: cough    Past Medical History  Diagnosis Date  . Hypertension   . Hyperlipidemia   . ED (erectile dysfunction)     Past Surgical History  Procedure Date  . Knee arthroscopy     right knee 1988, left knee 1998    Family History  Problem Relation Age of Onset  . Healthy Mother   . Coronary artery disease Father   . Cancer Father     bladder and colon cancer  . Diabetes Father   . Coronary artery disease Paternal Uncle   . Drug abuse Maternal Grandmother   . Hypertension Neg Hx     History   Social History  . Marital Status: Married    Spouse Name: N/A    Number of Children: 2  . 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 Smoking status: Never Smoker   . Smokeless tobacco: Never Used  . Alcohol Use: No  . Drug Use: No  . Sexually Active: Not on file   Other Topics Concern  . Not on file   Social History Narrative  . No narrative on file   Review of Systems  Constitutional: Negative for fatigue and unexpected weight change.       Wears seat belt  HENT: Negative for hearing loss,  congestion, rhinorrhea, dental problem and tinnitus.        Regular with dentist  Eyes: Negative for visual disturbance.       No diplopia or unilateral vision changes  Respiratory: Negative for cough, chest tightness and shortness of breath.   Cardiovascular: Negative for chest pain, palpitations and leg swelling.  Gastrointestinal: Negative for nausea, vomiting, abdominal pain, constipation and blood in stool.       No heartburn  Genitourinary: Negative for urgency, frequency and difficulty urinating.       Sexual function is okay---didn't take levitra due to cost.  Musculoskeletal: Positive for back pain and arthralgias. Negative for joint swelling.       Some knee and low back pain---uses tylenol or advil with success  Skin: Negative for pallor and rash.  Neurological: Positive for dizziness. Negative for syncope, weakness, light-headedness, numbness and headaches.       Still gets some orthostatic dizziness if he gets up quick  Hematological: Negative for adenopathy. Does not bruise/bleed easily.  Psychiatric/Behavioral: Negative for disturbed wake/sleep cycle and dysphoric mood. The patient is not nervous/anxious.        Objective:   Physical  Exam  Constitutional: He is oriented to person, place, and time. He appears well-developed and well-nourished. No distress.  HENT:  Head: Normocephalic and atraumatic.  Right Ear: External ear normal.  Left Ear: External ear normal.  Mouth/Throat: Oropharynx is clear and moist. No oropharyngeal exudate.  Eyes: Conjunctivae and EOM are normal. Pupils are equal, round, and reactive to light.  Neck: Normal range of motion. Neck supple. No thyromegaly present.  Cardiovascular: Normal rate, regular rhythm, normal heart sounds and intact distal pulses.  Exam reveals no gallop.   No murmur heard. Pulmonary/Chest: Effort normal and breath sounds normal. No respiratory distress. He has no wheezes. He has no rales.  Abdominal: Soft. There is no  tenderness.  Musculoskeletal: Normal range of motion. He exhibits no edema and no tenderness.  Lymphadenopathy:    He has no cervical adenopathy.  Neurological: He is alert and oriented to person, place, and time.  Skin: No rash noted. No erythema.       Multiple benign nevi  Psychiatric: He has a normal mood and affect. His behavior is normal. Thought content normal.          Assessment & Plan:

## 2012-01-31 ENCOUNTER — Encounter: Payer: Self-pay | Admitting: *Deleted

## 2012-12-01 ENCOUNTER — Other Ambulatory Visit: Payer: Self-pay | Admitting: Internal Medicine

## 2013-01-27 ENCOUNTER — Encounter: Payer: BC Managed Care – PPO | Admitting: Internal Medicine

## 2013-01-27 DIAGNOSIS — Z0289 Encounter for other administrative examinations: Secondary | ICD-10-CM

## 2013-02-27 ENCOUNTER — Other Ambulatory Visit: Payer: Self-pay | Admitting: Internal Medicine

## 2013-03-02 ENCOUNTER — Telehealth: Payer: Self-pay

## 2013-03-02 NOTE — Telephone Encounter (Signed)
Pt recently flew to [place] and had severe attack of dizziness and nausea while flying; pt saw doctor in [place] who prescribed Meclizine 25 mg taking one tab by mouth three times a day as needed for dizziness and Promethazine 25 mg one tab by mouth q6-8 h prn for nausea. Pt said flight back had no problems taking med. Pt has to fly out again on 03/15/13 and request these 2 meds sent to [facility] Pt request cb. Pt scheduled CPX 03/19/13.

## 2013-03-02 NOTE — Telephone Encounter (Signed)
Left message on machine that medication is OTC and Dr. Alphonsus Sias only recommends one, advised pt to call back if further questions.

## 2013-03-02 NOTE — Telephone Encounter (Signed)
I would generally recommend one or the other. If inner ear type dizziness, the meclizine is better Can send Rx for #100 x 0 Tid prn for dizziness  Is also available OTC

## 2013-03-08 ENCOUNTER — Other Ambulatory Visit: Payer: Self-pay | Admitting: Internal Medicine

## 2013-03-19 ENCOUNTER — Ambulatory Visit (INDEPENDENT_AMBULATORY_CARE_PROVIDER_SITE_OTHER): Payer: BC Managed Care – PPO | Admitting: Internal Medicine

## 2013-03-19 ENCOUNTER — Encounter: Payer: Self-pay | Admitting: Internal Medicine

## 2013-03-19 VITALS — BP 138/90 | HR 72 | Temp 98.1°F | Ht 76.5 in | Wt 242.0 lb

## 2013-03-19 DIAGNOSIS — I10 Essential (primary) hypertension: Secondary | ICD-10-CM

## 2013-03-19 DIAGNOSIS — E785 Hyperlipidemia, unspecified: Secondary | ICD-10-CM

## 2013-03-19 DIAGNOSIS — Z125 Encounter for screening for malignant neoplasm of prostate: Secondary | ICD-10-CM

## 2013-03-19 DIAGNOSIS — Z0000 Encounter for general adult medical examination without abnormal findings: Secondary | ICD-10-CM

## 2013-03-19 LAB — BASIC METABOLIC PANEL
BUN: 16 mg/dL (ref 6–23)
CO2: 31 mEq/L (ref 19–32)
GFR: 84.9 mL/min (ref >60.00)
Glucose, Bld: 92 mg/dL (ref 70–99)
Potassium: 3.7 mEq/L (ref 3.5–5.1)
Sodium: 137 mEq/L (ref 135–145)

## 2013-03-19 LAB — CBC WITH DIFFERENTIAL/PLATELET
Basophils Absolute: 0 10*3/uL (ref 0.0–0.1)
Basophils Relative: 0.6 % (ref 0.0–3.0)
Eosinophils Absolute: 0.1 10*3/uL (ref 0.0–0.7)
Eosinophils Relative: 0.8 % (ref 0.0–5.0)
HCT: 45.6 % (ref 39.0–52.0)
Hemoglobin: 15.5 g/dL (ref 13.0–17.0)
Lymphocytes Relative: 30.5 % (ref 12.0–46.0)
Lymphs Abs: 2.6 10*3/uL (ref 0.7–4.0)
MCHC: 34 g/dL (ref 30.0–36.0)
Monocytes Relative: 6.6 % (ref 3.0–12.0)
RDW: 13.5 % (ref 11.5–14.6)
WBC: 8.5 10*3/uL (ref 4.5–10.5)

## 2013-03-19 LAB — HEPATIC FUNCTION PANEL: Albumin: 4.1 g/dL (ref 3.5–5.2)

## 2013-03-19 LAB — TSH: TSH: 1.72 u[IU]/mL (ref 0.35–5.50)

## 2013-03-19 LAB — PSA: PSA: 0.81 ng/mL (ref 0.10–4.00)

## 2013-03-19 MED ORDER — TADALAFIL 20 MG PO TABS: 20.0000 mg | ORAL_TABLET | Freq: Every day | ORAL | Status: DC | PRN

## 2013-03-19 NOTE — Assessment & Plan Note (Signed)
Healthy but needs to work on fitness Discussed PSA---will check

## 2013-03-19 NOTE — Patient Instructions (Addendum)
If the black spots get bigger in the right temple area, you should see a dermatologist.  DASH Diet The DASH diet stands for "Dietary Approaches to Stop Hypertension." It is a healthy eating plan that has been shown to reduce high blood pressure (hypertension) in as little as 14 days, while also possibly providing other significant health benefits. These other health benefits include reducing the risk of breast cancer after menopause and reducing the risk of type 2 diabetes, heart disease, colon cancer, and stroke. Health benefits also include weight loss and slowing kidney failure in patients with chronic kidney disease.  DIET GUIDELINES  Limit salt (sodium). Your diet should contain less than 1500 mg of sodium daily.  Limit refined or processed carbohydrates. Your diet should include mostly whole grains. Desserts and added sugars should be used sparingly.  Include small amounts of heart-healthy fats. These types of fats include nuts, oils, and tub margarine. Limit saturated and trans fats. These fats have been shown to be harmful in the body. CHOOSING FOODS  The following food groups are based on a 2000 calorie diet. See your Registered Dietitian for individual calorie needs. Grains and Grain Products (6 to 8 servings daily)  Eat More Often: Whole-wheat bread, brown rice, whole-grain or wheat pasta, quinoa, popcorn without added fat or salt (air popped).  Eat Less Often: White bread, white pasta, white rice, cornbread. Vegetables (4 to 5 servings daily)  Eat More Often: Fresh, frozen, and canned vegetables. Vegetables may be raw, steamed, roasted, or grilled with a minimal amount of fat.  Eat Less Often/Avoid: Creamed or fried vegetables. Vegetables in a cheese sauce. Fruit (4 to 5 servings daily)  Eat More Often: All fresh, canned (in natural juice), or frozen fruits. Dried fruits without added sugar. One hundred percent fruit juice ( cup [237 mL] daily).  Eat Less Often: Dried fruits  with added sugar. Canned fruit in light or heavy syrup. [employer], Fish, and Poultry (2 servings or less daily. One serving is 3 to 4 oz [85-114 g]).  Eat More Often: Ninety percent or leaner ground beef, tenderloin, sirloin. Round cuts of beef, chicken breast, [place] breast. All fish. Grill, bake, or broil your meat. Nothing should be fried.  Eat Less Often/Avoid: Fatty cuts of meat, [place], or chicken leg, thigh, or wing. Fried cuts of meat or fish. Dairy (2 to 3 servings)  Eat More Often: Low-fat or fat-free milk, low-fat plain or light yogurt, reduced-fat or part-skim cheese.  Eat Less Often/Avoid: Milk (whole, 2%).Whole milk yogurt. Full-fat cheeses. Nuts, Seeds, and Legumes (4 to 5 servings per week)  Eat More Often: All without added salt.  Eat Less Often/Avoid: Salted nuts and seeds, canned beans with added salt. Fats and Sweets (limited)  Eat More Often: Vegetable oils, tub margarines without trans fats, sugar-free gelatin. Mayonnaise and salad dressings.  Eat Less Often/Avoid: Coconut oils, palm oils, butter, stick margarine, cream, half and half, cookies, candy, pie. FOR MORE INFORMATION The Dash Diet Eating Plan: www.dashdiet.org Document Released: 06/20/2011 Document Revised: 09/23/2011 Document Reviewed: 06/20/2011 [facility] Patient Information 2014 [place], [place].

## 2013-03-19 NOTE — Progress Notes (Signed)
Subjective:    Patient ID: Alex Mueller, male    DOB: 08/06/1960, 52 y.o.   MRN: [id number]  HPI Here for physical  Had some dizziness with recent trip--needed to go to ER and get fluid Chronic motion sickness but had never flown before Did better with meclizine Occasionally will get vertigo if under a sink working, for example  Has spot on right temple [name] noticed it  Not doing much for fitness Stays active at work Weight is up a few pounds  Current Outpatient Prescriptions on File Prior to Visit  Medication Sig Dispense Refill  . aspirin 81 MG tablet Take 81 mg by mouth daily.        Marland Kitchen EPINEPHrine (EPIPEN) 0.3 mg/0.3 mL DEVI Inject 0.3 mLs (0.3 mg total) into the muscle once.  1 Device  1  . losartan-hydrochlorothiazide (HYZAAR) 100-25 MG per tablet TAKE 1 TABLET BY MOUTH DAILY.  90 tablet  0  . vardenafil (LEVITRA) 20 MG tablet Take 1 tablet (20 mg total) by mouth daily as needed.  10 tablet  3   No current facility-administered medications on file prior to visit.    Allergies  Allergen Reactions  . Lisinopril     REACTION: cough    Past Medical History  Diagnosis Date  . Hypertension   . Hyperlipidemia   . ED (erectile dysfunction)     Past Surgical History  Procedure Laterality Date  . Knee arthroscopy      right knee 1988, left knee 1998    Family History  Problem Relation Age of Onset  . Healthy Mother   . Coronary artery disease Father   . Cancer Father     bladder and colon cancer  . Diabetes Father   . Coronary artery disease Paternal Uncle   . Drug abuse Maternal Grandmother   . Hypertension Neg Hx     History   Social History  . Marital Status: Married    Spouse Name: N/A    Number of Children: 2  . 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 Smoking status: Never Smoker   . Smokeless tobacco: Never Used  . Alcohol Use: No  . Drug Use: No  .  Sexual Activity: Not on file   Other Topics Concern  . Not on file   Social History Narrative  . No narrative on file   Review of Systems  Constitutional: Positive for unexpected weight change. Negative for fatigue.       Wears seat belt  HENT: Negative for hearing loss, congestion, rhinorrhea, dental problem and tinnitus.        Regular with dentist  Eyes: Negative for visual disturbance.       No diplopia or unilateral vision loss  Respiratory: Negative for cough, chest tightness and shortness of breath.   Cardiovascular: Negative for chest pain, palpitations and leg swelling.  Gastrointestinal: Negative for nausea, vomiting, abdominal pain, constipation and blood in stool.       No heartburn  Endocrine: Negative for cold intolerance and heat intolerance.  Genitourinary: Negative for urgency, frequency and difficulty urinating.       Mild ED-- never got the levitra due to cost Liked the cialis best  Musculoskeletal: Positive for back pain. Negative for joint swelling and arthralgias.       Hurt back at work some months ago--has recovered  Skin: Negative for rash.  Skin lesion right temple   Allergic/Immunologic: Negative for environmental allergies and immunocompromised state.  Neurological: Negative for dizziness, syncope, weakness, light-headedness, numbness and headaches.  Hematological: Negative for adenopathy. Does not bruise/bleed easily.  Psychiatric/Behavioral: Negative for sleep disturbance and dysphoric mood. The patient is not hyperactive.        Objective:   Physical Exam  Constitutional: He is oriented to person, place, and time. He appears well-developed and well-nourished. No distress.  HENT:  Head: Normocephalic and atraumatic.  Right Ear: External ear normal.  Left Ear: External ear normal.  Mouth/Throat: Oropharynx is clear and moist. No oropharyngeal exudate.  Eyes: Conjunctivae and EOM are normal. Pupils are equal, round, and reactive to light.   Neck: Normal range of motion. Neck supple. No thyromegaly present.  Cardiovascular: Normal rate, regular rhythm, normal heart sounds and intact distal pulses.  Exam reveals no gallop.   No murmur heard. Pulmonary/Chest: Effort normal and breath sounds normal. No respiratory distress. He has no wheezes. He has no rales.  Abdominal: Soft. There is no tenderness.  Musculoskeletal: He exhibits no edema and no tenderness.  Lymphadenopathy:    He has no cervical adenopathy.  Neurological: He is alert and oriented to person, place, and time.  Skin:  Apparent seborrheic keratosis---but with 2 small dark spots in it (right temple) Many benign nevi  Psychiatric: He has a normal mood and affect. His behavior is normal.          Assessment & Plan:

## 2013-03-19 NOTE — Assessment & Plan Note (Signed)
BP Readings from Last 3 Encounters:  03/19/13 138/90  01/28/12 120/80  07/24/11 138/76   Good control No changes

## 2013-03-19 NOTE — Assessment & Plan Note (Signed)
Will defer recheck this year He is going to work on fitness

## 2013-03-24 ENCOUNTER — Encounter: Payer: Self-pay | Admitting: Family Medicine

## 2013-06-03 ENCOUNTER — Other Ambulatory Visit: Payer: Self-pay | Admitting: Internal Medicine

## 2013-06-04 NOTE — Telephone Encounter (Signed)
Pt checking on refill request; advised pt just refilled and pt will ck with pharmacy.

## 2013-07-05 ENCOUNTER — Telehealth: Payer: Self-pay | Admitting: Internal Medicine

## 2013-07-05 NOTE — Telephone Encounter (Signed)
Patient Information:  Caller Name: Kire  Phone: [phone]  Patient: Majesty, Oehlert  Gender: Male  DOB: 09/04/1960  Age: 52 Years  PCP: Tillman Abide [facility])  Office Follow Up:  Does the office need to follow up with this patient?: No  Instructions For The Office: N/A  RN Note:  Already has Appt tomorrow about noon in office.  Symptoms  Reason For Call & Symptoms: Swollen uvula noted this am 12/22.  Has noted dark red throat that feels irritated.  Talked differently this am and kept feeling like was swallowing end of uvula at times.  Swelling has continued.  Rare cough  Reviewed Health History In EMR: Yes  Reviewed Medications In EMR: Yes  Reviewed Allergies In EMR: Yes  Reviewed Surgeries / Procedures: Yes  Date of Onset of Symptoms: 07/05/2013  Treatments Tried: Benadryl,  Motrin  Treatments Tried Worked: No  Guideline(s) Used:  Sore Throat  Disposition Per Guideline:   Home Care  Reason For Disposition Reached:   Sore throat  Advice Given:  Soft Diet:   Cold drinks and milk shakes are especially good (Reason: swollen tonsils can make some foods hard to swallow).  Liquids:  Adequate liquid intake is important to prevent dehydration. Drink 6-8 glasses of water per day.  Call Back If:  Sore throat is the main symptom and it lasts longer than 24 hours  Sore throat is mild but lasts longer than 4 days  Fever lasts longer than 3 days  You become worse.  Patient Will Follow Care Advice:  YES

## 2013-07-06 ENCOUNTER — Encounter: Payer: Self-pay | Admitting: Internal Medicine

## 2013-07-06 ENCOUNTER — Ambulatory Visit (INDEPENDENT_AMBULATORY_CARE_PROVIDER_SITE_OTHER): Payer: BC Managed Care – PPO | Admitting: Internal Medicine

## 2013-07-06 VITALS — BP 128/88 | HR 67 | Temp 98.4°F | Wt 244.0 lb

## 2013-07-06 DIAGNOSIS — B9789 Other viral agents as the cause of diseases classified elsewhere: Secondary | ICD-10-CM | POA: Insufficient documentation

## 2013-07-06 DIAGNOSIS — J029 Acute pharyngitis, unspecified: Secondary | ICD-10-CM

## 2013-07-06 NOTE — Telephone Encounter (Signed)
appt today Will evaluate then

## 2013-07-06 NOTE — Patient Instructions (Signed)
Sore Throat A sore throat is pain, burning, irritation, or scratchiness of the throat. There is often pain or tenderness when swallowing or talking. A sore throat may be accompanied by other symptoms, such as coughing, sneezing, fever, and swollen neck glands. A sore throat is often the first sign of another sickness, such as a cold, flu, strep throat, or mononucleosis (commonly known as mono). Most sore throats go away without medical treatment. CAUSES  The most common causes of a sore throat include:  A viral infection, such as a cold, flu, or mono.  A bacterial infection, such as strep throat, tonsillitis, or whooping cough.  Seasonal allergies.  Dryness in the air.  Irritants, such as smoke or pollution.  Gastroesophageal reflux disease (GERD). HOME CARE INSTRUCTIONS   Only take over-the-counter medicines as directed by your caregiver.  Drink enough fluids to keep your urine clear or pale yellow.  Rest as needed.  Try using throat sprays, lozenges, or sucking on hard candy to ease any pain (if older than 4 years or as directed).  Sip warm liquids, such as broth, herbal tea, or warm water with honey to relieve pain temporarily. You may also eat or drink cold or frozen liquids such as frozen ice pops.  Gargle with salt water (mix 1 tsp salt with 8 oz of water).  Do not smoke and avoid secondhand smoke.  Put a cool-mist humidifier in your bedroom at night to moisten the air. You can also turn on a hot shower and sit in the bathroom with the door closed for 5 10 minutes. SEEK IMMEDIATE MEDICAL CARE IF:  You have difficulty breathing.  You are unable to swallow fluids, soft foods, or your saliva.  You have increased swelling in the throat.  Your sore throat does not get better in 7 days.  You have nausea and vomiting.  You have a fever or persistent symptoms for more than 2 3 days.  You have a fever and your symptoms suddenly get worse. MAKE SURE YOU:   Understand  these instructions.  Will watch your condition.  Will get help right away if you are not doing well or get worse. Document Released: 08/08/2004 Document Revised: 06/17/2012 Document Reviewed: 03/08/2012 [employer] Patient Information 2014 [employer].  

## 2013-07-06 NOTE — Progress Notes (Signed)
   Subjective:    Patient ID: Alex Mueller, male    DOB: 1960-07-20, 52 y.o.   MRN: [id number]  HPI Having sore throat Uvula was so swollen he could feel it when swallowing This is some better today  No fever No cough No sig nasal congestion or drainage Slight pressure in left ear No SOB  Used some ibuprofen and benedryl---seemed to help  Current Outpatient Prescriptions on File Prior to Visit  Medication Sig Dispense Refill  . aspirin 81 MG tablet Take 81 mg by mouth daily.        Marland Kitchen EPINEPHrine (EPIPEN) 0.3 mg/0.3 mL DEVI Inject 0.3 mLs (0.3 mg total) into the muscle once.  1 Device  1  . losartan-hydrochlorothiazide (HYZAAR) 100-25 MG per tablet TAKE 1 TABLET BY MOUTH DAILY.  90 tablet  0  . tadalafil (CIALIS) 20 MG tablet Take 1 tablet (20 mg total) by mouth daily as needed for erectile dysfunction.  3 tablet  11   No current facility-administered medications on file prior to visit.    Allergies  Allergen Reactions  . Lisinopril     REACTION: cough    Past Medical History  Diagnosis Date  . Hypertension   . Hyperlipidemia   . ED (erectile dysfunction)     Past Surgical History  Procedure Laterality Date  . Knee arthroscopy      right knee 1988, left knee 1998    Family History  Problem Relation Age of Onset  . Healthy Mother   . Coronary artery disease Father   . Cancer Father     bladder and colon cancer  . Diabetes Father   . Coronary artery disease Paternal Uncle   . Drug abuse Maternal Grandmother   . Hypertension Neg Hx     History   Social History  . Marital Status: Married    Spouse Name: N/A    Number of Children: 2  . 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 Smoking status: Never Smoker   . Smokeless tobacco: Never Used  . Alcohol Use: No  . Drug Use: No  . Sexual Activity: Not on file   Other Topics Concern  . Not on file   Social History  Narrative  . No narrative on file   Review of Systems No rash No vomiting or diarrhea Appetite is okay No strep exposure     Objective:   Physical Exam  Constitutional: He appears well-developed and well-nourished. No distress.  HENT:  No sinus tenderness TMs normal Mild nasal congestion uvual slightly red---no tonsillar enlargement or exudates  Neck: Normal range of motion. Neck supple.  Pulmonary/Chest: Effort normal and breath sounds normal. No respiratory distress. He has no wheezes. He has no rales.  Lymphadenopathy:    He has no cervical adenopathy.          Assessment & Plan:

## 2013-07-06 NOTE — Assessment & Plan Note (Signed)
Discussed supportive care and time course No testing needed

## 2013-08-30 ENCOUNTER — Other Ambulatory Visit: Payer: Self-pay | Admitting: Internal Medicine

## 2013-12-04 ENCOUNTER — Other Ambulatory Visit: Payer: Self-pay | Admitting: Internal Medicine

## 2014-03-04 ENCOUNTER — Other Ambulatory Visit: Payer: Self-pay | Admitting: Internal Medicine

## 2014-03-22 ENCOUNTER — Ambulatory Visit (INDEPENDENT_AMBULATORY_CARE_PROVIDER_SITE_OTHER): Payer: BC Managed Care – PPO | Admitting: Internal Medicine

## 2014-03-22 ENCOUNTER — Encounter: Payer: Self-pay | Admitting: Internal Medicine

## 2014-03-22 VITALS — BP 140/82 | HR 69 | Temp 97.7°F | Ht 76.5 in | Wt 242.0 lb

## 2014-03-22 DIAGNOSIS — Z0000 Encounter for general adult medical examination without abnormal findings: Secondary | ICD-10-CM

## 2014-03-22 DIAGNOSIS — I10 Essential (primary) hypertension: Secondary | ICD-10-CM

## 2014-03-22 LAB — CBC WITH DIFFERENTIAL/PLATELET
Basophils Absolute: 0 10*3/uL (ref 0.0–0.1)
Basophils Relative: 0.6 % (ref 0.0–3.0)
Eosinophils Absolute: 0.1 10*3/uL (ref 0.0–0.7)
Eosinophils Relative: 1.1 % (ref 0.0–5.0)
HCT: 48.6 % (ref 39.0–52.0)
Hemoglobin: 16.3 g/dL (ref 13.0–17.0)
LYMPHS PCT: 33.1 % (ref 12.0–46.0)
Lymphs Abs: 2.4 10*3/uL (ref 0.7–4.0)
MCHC: 33.5 g/dL (ref 30.0–36.0)
MCV: 89 fl (ref 78.0–100.0)
MONOS PCT: 6.8 % (ref 3.0–12.0)
Monocytes Absolute: 0.5 10*3/uL (ref 0.1–1.0)
Neutro Abs: 4.2 10*3/uL (ref 1.4–7.7)
Neutrophils Relative %: 58.4 % (ref 43.0–77.0)
PLATELETS: 268 10*3/uL (ref 150.0–400.0)
RBC: 5.46 Mil/uL (ref 4.22–5.81)
RDW: 13.8 % (ref 11.5–15.5)
WBC: 7.3 10*3/uL (ref 4.0–10.5)

## 2014-03-22 LAB — COMPREHENSIVE METABOLIC PANEL
ALK PHOS: 57 U/L (ref 39–117)
ALT: 23 U/L (ref 0–53)
AST: 18 U/L (ref 0–37)
Albumin: 4.1 g/dL (ref 3.5–5.2)
BUN: 11 mg/dL (ref 6–23)
CALCIUM: 9.5 mg/dL (ref 8.4–10.5)
CO2: 28 meq/L (ref 19–32)
Chloride: 101 mEq/L (ref 96–112)
Creatinine, Ser: 1.1 mg/dL (ref 0.4–1.5)
GFR: 73.25 mL/min (ref >60.00)
GLUCOSE: 92 mg/dL (ref 70–99)
POTASSIUM: 4.5 meq/L (ref 3.5–5.1)
Sodium: 137 mEq/L (ref 135–145)
TOTAL PROTEIN: 7.5 g/dL (ref 6.0–8.3)
Total Bilirubin: 0.6 mg/dL (ref 0.2–1.2)

## 2014-03-22 LAB — LIPID PANEL
CHOLESTEROL: 186 mg/dL (ref 0–200)
HDL: 40.6 mg/dL (ref >39.00)
LDL Cholesterol: 122 mg/dL — ABNORMAL HIGH (ref 0–99)
NonHDL: 145.4
TRIGLYCERIDES: 119 mg/dL (ref 0.0–149.0)
Total CHOL/HDL Ratio: 5
VLDL: 23.8 mg/dL (ref 0.0–40.0)

## 2014-03-22 LAB — T4, FREE: FREE T4: 0.85 ng/dL (ref 0.60–1.60)

## 2014-03-22 NOTE — Progress Notes (Signed)
Subjective:    Patient ID: Alex Mueller, male    DOB: 21-Jun-1961, 53 y.o.   MRN: [id number]  HPI Here for physical  Having pain in left 4th toe Gets knife like pain after being on it for a while Has appt with Dr Paulla Dolly It is turned under other toe  No other concerns Same job  Current Outpatient Prescriptions on File Prior to Visit  Medication Sig Dispense Refill  . aspirin 81 MG tablet Take 81 mg by mouth daily.        Marland Kitchen EPINEPHrine (EPIPEN) 0.3 mg/0.3 mL DEVI Inject 0.3 mLs (0.3 mg total) into the muscle once.  1 Device  1  . losartan-hydrochlorothiazide (HYZAAR) 100-25 MG per tablet TAKE 1 TABLET BY MOUTH DAILY.  90 tablet  0  . tadalafil (CIALIS) 20 MG tablet Take 1 tablet (20 mg total) by mouth daily as needed for erectile dysfunction.  3 tablet  11   No current facility-administered medications on file prior to visit.    Allergies  Allergen Reactions  . Lisinopril     REACTION: cough    Past Medical History  Diagnosis Date  . Hypertension   . Hyperlipidemia   . ED (erectile dysfunction)     Past Surgical History  Procedure Laterality Date  . Knee arthroscopy      right knee 1988, left knee 1998    Family History  Problem Relation Age of Onset  . Healthy Mother   . Coronary artery disease Father   . Cancer Father     bladder and colon cancer  . Diabetes Father   . Coronary artery disease Paternal Uncle   . Drug abuse Maternal Grandmother   . Hypertension Neg Hx     History   Social History  . Marital Status: Married    Spouse Name: N/A    Number of Children: 2  . Years of Education: N/A   Occupational History  . [employer] History Main Topics  . Smoking status: Never Smoker   . Smokeless tobacco: Never Used  . Alcohol Use: No  . Drug Use: No  . Sexual Activity: Not on file   Other Topics Concern  . Not on file   Social History Narrative  . No narrative on file   Review of Systems    Constitutional: Negative for fatigue and unexpected weight change.       Wears seat belt  HENT: Negative for dental problem, hearing loss and tinnitus.        Regular with dentist  Eyes: Negative for visual disturbance.       No diplopia or unilateral vision loss  Respiratory: Negative for cough, chest tightness and shortness of breath.   Cardiovascular: Negative for chest pain, palpitations and leg swelling.  Gastrointestinal: Negative for nausea, vomiting, abdominal pain, constipation and blood in stool.       No heartburn  Endocrine: Negative for polydipsia and polyuria.  Genitourinary: Negative for dysuria, urgency, frequency and difficulty urinating.       No nocturia Irregular with cialis-- it does help though  Musculoskeletal: Positive for back pain. Negative for arthralgias and joint swelling.       Occ low back pain--- strains on job. Will uses advil with success  Skin: Negative for rash.       No suspicious lesions  Allergic/Immunologic: Positive for environmental allergies. Negative for immunocompromised state.       Mild spring symptoms--occ OTC med  Neurological: Negative for dizziness, syncope, weakness, light-headedness, numbness and headaches.  Hematological: Negative for adenopathy. Does not bruise/bleed easily.  Psychiatric/Behavioral: Negative for sleep disturbance and dysphoric mood. The patient is not nervous/anxious.        Objective:   Physical Exam  Constitutional: He is oriented to person, place, and time. He appears well-developed and well-nourished. No distress.  HENT:  Head: Normocephalic and atraumatic.  Right Ear: External ear normal.  Left Ear: External ear normal.  Mouth/Throat: Oropharynx is clear and moist. No oropharyngeal exudate.  Eyes: Conjunctivae and EOM are normal. Pupils are equal, round, and reactive to light.  Neck: Normal range of motion. Neck supple. No thyromegaly present.  Cardiovascular: Normal rate, regular rhythm, normal heart  sounds and intact distal pulses.  Exam reveals no gallop.   No murmur heard. Pulmonary/Chest: Effort normal and breath sounds normal. No respiratory distress. He has no wheezes. He has no rales.  Abdominal: Soft. There is no tenderness.  Musculoskeletal: He exhibits no edema and no tenderness.  Lymphadenopathy:    He has no cervical adenopathy.  Neurological: He is alert and oriented to person, place, and time.  Skin: No rash noted. No erythema.  Multiple benign nevi  Psychiatric: He has a normal mood and affect. His behavior is normal.          Assessment & Plan:

## 2014-03-22 NOTE — Progress Notes (Signed)
Pre visit review using our clinic review tool, if applicable. No additional management support is needed unless otherwise documented below in the visit note. 

## 2014-03-22 NOTE — Assessment & Plan Note (Signed)
BP Readings from Last 3 Encounters:  03/22/14 140/82  07/06/13 128/88  03/19/13 138/90   Acceptable control

## 2014-03-22 NOTE — Assessment & Plan Note (Signed)
Generally healthy Discussed fitness BMI overstates his weight issues---fairly close to ideal body weight Recommended flu vaccine--he will consider Not due for PSA

## 2014-03-24 ENCOUNTER — Ambulatory Visit (INDEPENDENT_AMBULATORY_CARE_PROVIDER_SITE_OTHER): Payer: BC Managed Care – PPO | Admitting: Podiatry

## 2014-03-24 ENCOUNTER — Ambulatory Visit (INDEPENDENT_AMBULATORY_CARE_PROVIDER_SITE_OTHER): Payer: BC Managed Care – PPO

## 2014-03-24 ENCOUNTER — Encounter: Payer: Self-pay | Admitting: Podiatry

## 2014-03-24 VITALS — BP 125/78 | HR 64 | Resp 17

## 2014-03-24 DIAGNOSIS — M2040 Other hammer toe(s) (acquired), unspecified foot: Secondary | ICD-10-CM

## 2014-03-24 DIAGNOSIS — M2042 Other hammer toe(s) (acquired), left foot: Secondary | ICD-10-CM

## 2014-03-24 DIAGNOSIS — G5762 Lesion of plantar nerve, left lower limb: Secondary | ICD-10-CM

## 2014-03-24 DIAGNOSIS — G5760 Lesion of plantar nerve, unspecified lower limb: Secondary | ICD-10-CM

## 2014-03-24 NOTE — Progress Notes (Signed)
   Subjective:    Patient ID: Alex Mueller, male    DOB: June 02, 1961, 53 y.o.   MRN: [id number]  HPI  Pt presents with left 4th met pain, for one year now, states that it is a burning sensation with numbness and clicking sensation when walking, has worsened over the past year. Pain radiates over 4th met and numbness in on the plantar aspect.  Review of Systems  Constitutional: Positive for activity change.       Objective:   Physical Exam        Assessment & Plan:

## 2014-03-24 NOTE — Progress Notes (Signed)
Subjective:     Patient ID: Alex Mueller, male   DOB: Feb 13, 1961, 53 y.o.   MRN: [id number]  Toe Pain    patient presents stating I have been having burning pain in my left foot between the third and fourth toes for the last year and I have a significant structural malalignment of my fourth toe and I know I walk on it and if it left foot. States that this is been going on for a long time   Review of Systems  All other systems reviewed and are negative.      Objective:   Physical Exam  Nursing note and vitals reviewed. Constitutional: He is oriented to person, place, and time.  Cardiovascular: Intact distal pulses.   Musculoskeletal: Normal range of motion.  Neurological: He is oriented to person, place, and time.  Skin: Skin is warm.   neurovascular status intact with muscle strength adequate and range of motion subtalar and midtarsal joint within normal limits. Patient's found to have good digital perfusion and moderate depression of the arch upon weightbearing and is noted to have severe discomfort in the third interspace left is palpated and the metatarsal bones are squeezed together. Patient has significant distal deviation fourth toe left with hypertrophy of the distal interphalangeal joint     Assessment:     Probable acute neuroma symptoms of 1 year duration along with severe digital deformity fourth left that becomes painful    Plan:     H&P reviewed with patient as his x-ray and discussed treatment options. I do think ultimately this will need to be fixed do to the superior pain but were first to try conservative neuro lysis treatment and I educated him on this. I did a sterile prep to the area and injected with 1.3 cc purified D. hydrated alcohol and Marcaine and reappoint to recheck and find results in 2 weeks. Patient ultimately will require neurectomy and probable distal arthroplasty digits 4 left but again we will see his response first to conservative injection and he  would prefer to do the surgery over the Christmas holidays due to his work schedule

## 2014-04-07 ENCOUNTER — Encounter: Payer: Self-pay | Admitting: Podiatry

## 2014-04-07 ENCOUNTER — Ambulatory Visit (INDEPENDENT_AMBULATORY_CARE_PROVIDER_SITE_OTHER): Payer: BC Managed Care – PPO | Admitting: Podiatry

## 2014-04-07 VITALS — BP 90/51 | HR 40 | Resp 13 | Ht 77.0 in | Wt 235.0 lb

## 2014-04-07 DIAGNOSIS — M2042 Other hammer toe(s) (acquired), left foot: Secondary | ICD-10-CM

## 2014-04-07 DIAGNOSIS — M2040 Other hammer toe(s) (acquired), unspecified foot: Secondary | ICD-10-CM

## 2014-04-07 DIAGNOSIS — G5760 Lesion of plantar nerve, unspecified lower limb: Secondary | ICD-10-CM

## 2014-04-07 DIAGNOSIS — G5762 Lesion of plantar nerve, left lower limb: Secondary | ICD-10-CM

## 2014-04-07 NOTE — Progress Notes (Signed)
Subjective:     Patient ID: TADEUSZ STAHL, male   DOB: [birth year], 53 y.o.   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 Patient wants surgery and is scheduled in December for neurectomy third interspace and distal arthroplasty fourth toe left foot. Will reappoint for consult in December earlier if we may have to do another neuro lysis injection

## 2014-05-30 ENCOUNTER — Telehealth: Payer: Self-pay | Admitting: Internal Medicine

## 2014-05-30 NOTE — Telephone Encounter (Signed)
Patient Information:  Caller Name: Urian  Phone: [phone]  Patient: Alex Mueller, Alex Mueller  Gender: Male  DOB: 1960/12/13  Age: 53 Years  PCP: Viviana Simpler [facility])  Office Follow Up:  Does the office need to follow up with this patient?: No  Instructions For The Office: N/A  RN Note:  Home care advice given.  Symptoms  Reason For Call & Symptoms: Calling about high fever,headache and joint aches  Reviewed Health History In EMR: Yes  Reviewed Medications In EMR: Yes  Reviewed Allergies In EMR: Yes  Reviewed Surgeries / Procedures: Yes  Date of Onset of Symptoms: 05/29/2014  Treatments Tried: Advil  Treatments Tried Worked: No  Any Fever: Yes  Fever Taken: Oral  Fever Time Of Reading: 13:27:59  Fever Last Reading: 100.8  Guideline(s) Used:  Influenza - Seasonal  Disposition Per Guideline:   Home Care  Reason For Disposition Reached:   Probable influenza with no complications and not HIGH RISK  Advice Given:  Reassurance  For most healthy adults, influenza feels like a bad cold. The dangers of influenza for normal, healthy people (under 108 years of age) are overrated.  The treatment of influenza depends on your main symptoms. Generally, treatment is the same as for other viral respiratory infections (colds). Bed rest is unnecessary.  Here is some care advice that should help.  Treating the Symptoms of Flu  Fever, Muscle Aches, and Headache: For fever more than 101 F (38.3 C), muscle aches, and headaches, take acetaminophen every 4-6 hours (Adults 650 mg) OR ibuprofen every 6-8 hours (Adults 400-600 mg).  Hydrate: Drink extra liquids. If the air in your home is dry, use a humidifier.  Isolation is Needed Until After the Fever is Gone:   The CDC recommends that people with influenza-like illness remain at home until at least 24 hours after they are free of fever (100 F or 37.8C).  Do NOT go to work or school.  Patient Will Follow Care Advice:  YES

## 2014-05-30 NOTE — Telephone Encounter (Signed)
Please check on him tomorrow 

## 2014-05-31 NOTE — Telephone Encounter (Signed)
Patient states he feels better

## 2014-06-03 ENCOUNTER — Other Ambulatory Visit: Payer: Self-pay | Admitting: Internal Medicine

## 2014-06-08 ENCOUNTER — Other Ambulatory Visit: Payer: Self-pay | Admitting: Internal Medicine

## 2014-06-23 ENCOUNTER — Encounter: Payer: Self-pay | Admitting: Podiatry

## 2014-06-23 ENCOUNTER — Ambulatory Visit (INDEPENDENT_AMBULATORY_CARE_PROVIDER_SITE_OTHER): Payer: BC Managed Care – PPO | Admitting: Podiatry

## 2014-06-23 VITALS — BP 121/91 | HR 74 | Resp 16

## 2014-06-23 DIAGNOSIS — G5762 Lesion of plantar nerve, left lower limb: Secondary | ICD-10-CM

## 2014-06-23 DIAGNOSIS — M2042 Other hammer toe(s) (acquired), left foot: Secondary | ICD-10-CM

## 2014-06-23 NOTE — Patient Instructions (Signed)
Pre-Operative Instructions  Congratulations, you have decided to take an important step to improving your quality of life.  You can be assured that the doctors of [facility] will be with you every step of the way.  1. Plan to be at the surgery center/hospital at least 1 (one) hour prior to your scheduled time unless otherwise directed by the surgical center/hospital staff.  You must have a responsible adult accompany you, remain during the surgery and drive you home.  Make sure you have directions to the surgical center/hospital and know how to get there on time. 2. For hospital based surgery you will need to obtain a history and physical form from your family physician within 1 month prior to the date of surgery- we will give you a form for you primary physician.  3. We make every effort to accommodate the date you request for surgery.  There are however, times where surgery dates or times have to be moved.  We will contact you as soon as possible if a change in schedule is required.   4. No Aspirin/Ibuprofen for one week before surgery.  If you are on aspirin, any non-steroidal anti-inflammatory medications (Mobic, Aleve, Ibuprofen) you should stop taking it 7 days prior to your surgery.  You make take Tylenol  For pain prior to surgery.  5. Medications- If you are taking daily heart and blood pressure medications, seizure, reflux, allergy, asthma, anxiety, pain or diabetes medications, make sure the surgery center/hospital is aware before the day of surgery so they may notify you which medications to take or avoid the day of surgery. 6. No food or drink after midnight the night before surgery unless directed otherwise by surgical center/hospital staff. 7. No alcoholic beverages 24 hours prior to surgery.  No smoking 24 hours prior to or 24 hours after surgery. 8. Wear loose pants or shorts- loose enough to fit over bandages, boots, and casts. 9. No slip on shoes, sneakers are best. 10. Bring  your boot with you to the surgery center/hospital.  Also bring crutches or a walker if your physician has prescribed it for you.  If you do not have this equipment, it will be provided for you after surgery. 11. If you have not been contracted by the surgery center/hospital by the day before your surgery, call to confirm the date and time of your surgery. 12. Leave-time from work may vary depending on the type of surgery you have.  Appropriate arrangements should be made prior to surgery with your employer. 13. Prescriptions will be provided immediately following surgery by your doctor.  Have these filled as soon as possible after surgery and take the medication as directed. 14. Remove nail polish on the operative foot. 15. Wash the night before surgery.  The night before surgery wash the foot and leg well with the antibacterial soap provided and water paying special attention to beneath the toenails and in between the toes.  Rinse thoroughly with water and dry well with a towel.  Perform this wash unless told not to do so by your physician.  Enclosed: 1 Ice pack (please put in freezer the night before surgery)   1 Hibiclens skin cleaner   Pre-op Instructions  If you have any questions regarding the instructions, do not hesitate to call our office.  [place]: [address] [place], [place] [address] [phone]  [place]: [address] [place], [place] [address] [phone]  [place]: [address]  , [place] [address] [phone]  Dr. Richard   Tuchman DPM, Dr. Norman Regal DPM Dr. Richard Sikora DPM, Dr. M. Todd Hyatt DPM, Dr. Kathryn Egerton DPM 

## 2014-06-23 NOTE — Progress Notes (Signed)
Subjective:     Patient ID: Alex Mueller, male   DOB: December 21, 1960, 53 y.o.   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 Patient wants it fixed and I have recommended neurectomy of the third intermetatarsal space along with distal arthroplasty digits 4 left foot. Patient wants surgery and understands the procedure and at this time I allowed him to read the consent form line byline explaining the procedure and all possible complications that can occur. Patient is willing to accept risk and understands total recovery. Can take up to 6 months and at this time signs consent form and is given all preoperative instructions. Patient is scheduled for surgery in the next several weeks and is encouraged to call with any questions

## 2014-07-05 DIAGNOSIS — M2042 Other hammer toe(s) (acquired), left foot: Secondary | ICD-10-CM

## 2014-07-05 DIAGNOSIS — G5760 Lesion of plantar nerve, unspecified lower limb: Secondary | ICD-10-CM

## 2014-07-05 HISTORY — PX: FOOT SURGERY: SHX648

## 2014-07-11 ENCOUNTER — Ambulatory Visit (INDEPENDENT_AMBULATORY_CARE_PROVIDER_SITE_OTHER): Payer: BC Managed Care – PPO | Admitting: Podiatry

## 2014-07-11 ENCOUNTER — Ambulatory Visit (INDEPENDENT_AMBULATORY_CARE_PROVIDER_SITE_OTHER): Payer: BC Managed Care – PPO

## 2014-07-11 ENCOUNTER — Encounter: Payer: Self-pay | Admitting: Podiatry

## 2014-07-11 VITALS — BP 117/74 | HR 79 | Resp 16

## 2014-07-11 DIAGNOSIS — M2042 Other hammer toe(s) (acquired), left foot: Secondary | ICD-10-CM

## 2014-07-11 DIAGNOSIS — G5762 Lesion of plantar nerve, left lower limb: Secondary | ICD-10-CM

## 2014-07-11 NOTE — Progress Notes (Signed)
Subjective:     Patient ID: Alex Mueller, male   DOB: 04/24/1961, 53 y.o.   MRN: [id number]  HPI patient states that he's doing fine with his left foot and that he is able to walk without discomfort and minimal swelling   Review of Systems     Objective:   Physical Exam Neurovascular status intact negative Homans sign noted with well-healing surgical site fourth toe left third interspace left with good alignment of the toe and wound edges well coapted with stitches in place    Assessment:     Healing well after having arthroplasty fourth left and neuroma excision third interspace left foot    Plan:     H&P performed and bandage the foot and advised on elevation and continued physical therapy. Gradual return soft shoe gear and reappoint 2 weeks for suture removal earlier if any issues should occur and reviewed x-rays

## 2014-07-13 ENCOUNTER — Encounter: Payer: Self-pay | Admitting: Internal Medicine

## 2014-07-20 ENCOUNTER — Encounter: Payer: Self-pay | Admitting: Internal Medicine

## 2014-07-20 ENCOUNTER — Ambulatory Visit (INDEPENDENT_AMBULATORY_CARE_PROVIDER_SITE_OTHER): Payer: BC Managed Care – PPO | Admitting: Internal Medicine

## 2014-07-20 VITALS — BP 106/82 | HR 78 | Temp 98.0°F | Ht 77.0 in | Wt 244.2 lb

## 2014-07-20 DIAGNOSIS — J0100 Acute maxillary sinusitis, unspecified: Secondary | ICD-10-CM

## 2014-07-20 MED ORDER — AMOXICILLIN 500 MG PO CAPS: 500.0000 mg | ORAL_CAPSULE | Freq: Three times a day (TID) | ORAL | Status: DC

## 2014-07-20 NOTE — Progress Notes (Signed)
   Subjective:    Patient ID: Alex Mueller, male    DOB: November 02, 1960, 54 y.o.   MRN: [id number]  HPI  His symptoms began approximately 10 days ago as rhinitis, cough, sneezing, and pressure in his ears  This has persisted; now he is having dark brown nasal secretions with some blood. He describes pain in both the frontal and x-ray sinus areas.  He's used Afrin and Advil cold  He has never smoked.  Review of Systems    He denies fever, chills, sweats  He has only minor nonproductive cough.  He is not having extrinsic symptoms of itchy, watery eyes. The cough is not associated with wheezing or shortness of breath.    Objective:   Physical Exam  General appearance:good health ;well nourished; no acute distress or increased work of breathing is present.  No  lymphadenopathy about the head, neck, or axilla noted. Goatee  Eyes: No conjunctival inflammation or lid edema is present. There is no scleral icterus.  Ears:  External ear exam shows no significant lesions or deformities.  Otoscopic examination reveals clear canals, tympanic membranes are intact bilaterally without bulging, retraction, inflammation or discharge.  Nose:  External nasal examination shows no deformity or inflammation. Nasal mucosa are very erythematous without lesions or exudates. No septal dislocation or deviation.No obstruction to airflow.   Oral exam: Dental hygiene is good; lips and gums are healthy appearing.There is no oropharyngeal erythema or exudate noted.   Neck:  No deformities, thyromegaly, masses, or tenderness noted.   Supple with full range of motion without pain.   Heart:  Normal rate and regular rhythm. S1 and S2 normal without gallop, murmur, click, rub or other extra sounds.   Lungs:Chest clear to auscultation; no wheezes, rhonchi,rales ,or rubs present.No increased work of breathing.    Extremities:  No cyanosis, edema, or clubbing  noted    Skin: Warm & dry w/o jaundice or  tenting.       Assessment & Plan:  #1 rhinosinusitis without significant bronchitis  Plan: Nasal hygiene interventions discussed. See prescription medications

## 2014-07-20 NOTE — Patient Instructions (Signed)

## 2014-07-20 NOTE — Progress Notes (Signed)
Pre visit review using our clinic review tool, if applicable. No additional management support is needed unless otherwise documented below in the visit note. 

## 2014-07-25 ENCOUNTER — Ambulatory Visit (INDEPENDENT_AMBULATORY_CARE_PROVIDER_SITE_OTHER): Payer: BC Managed Care – PPO | Admitting: Podiatry

## 2014-07-25 DIAGNOSIS — M2042 Other hammer toe(s) (acquired), left foot: Secondary | ICD-10-CM

## 2014-07-25 DIAGNOSIS — G5762 Lesion of plantar nerve, left lower limb: Secondary | ICD-10-CM

## 2014-07-27 ENCOUNTER — Encounter: Payer: Self-pay | Admitting: Podiatry

## 2014-07-27 NOTE — Progress Notes (Unsigned)
DOS 07/05/2014 left 3rd neurectomy, left 4th hammer toe repair distal

## 2014-07-27 NOTE — Progress Notes (Signed)
Subjective:     Patient ID: Alex Mueller, male   DOB: [birth year], 54 y.o.   MRN: [id number]  HPI patient presents for suture removal left stating he's doing fine and is actually wearing a boot at this time Review of Systems     Objective:   Physical Exam Neurovascular status intact no change in health history with good wound edges coapted fourth toe left third interspace left with moderate swelling fourth toe secondary to his activity    Assessment:     Stitches removed wound edges coapted well    Plan:     Dispensed anklet with instructions on usage and gradual increase in activity over the next few weeks but still being careful. Explained swelling may last several months and reappoint as needed

## 2014-07-29 ENCOUNTER — Encounter: Payer: Self-pay | Admitting: Podiatry

## 2014-08-07 ENCOUNTER — Emergency Department (HOSPITAL_BASED_OUTPATIENT_CLINIC_OR_DEPARTMENT_OTHER)
Admission: EM | Admit: 2014-08-07 | Discharge: 2014-08-07 | Disposition: A | Discharge status: Home / Self Care | Payer: BC Managed Care – PPO | Attending: Emergency Medicine | Admitting: Emergency Medicine

## 2014-08-07 ENCOUNTER — Encounter (HOSPITAL_BASED_OUTPATIENT_CLINIC_OR_DEPARTMENT_OTHER): Payer: Self-pay | Admitting: *Deleted

## 2014-08-07 DIAGNOSIS — Z87448 Personal history of other diseases of urinary system: Secondary | ICD-10-CM | POA: Insufficient documentation

## 2014-08-07 DIAGNOSIS — I10 Essential (primary) hypertension: Secondary | ICD-10-CM | POA: Diagnosis not present

## 2014-08-07 DIAGNOSIS — Z7982 Long term (current) use of aspirin: Secondary | ICD-10-CM | POA: Diagnosis not present

## 2014-08-07 DIAGNOSIS — M5416 Radiculopathy, lumbar region: Secondary | ICD-10-CM | POA: Insufficient documentation

## 2014-08-07 DIAGNOSIS — Z79899 Other long term (current) drug therapy: Secondary | ICD-10-CM | POA: Diagnosis not present

## 2014-08-07 DIAGNOSIS — M545 Low back pain: Secondary | ICD-10-CM | POA: Diagnosis present

## 2014-08-07 DIAGNOSIS — Z8639 Personal history of other endocrine, nutritional and metabolic disease: Secondary | ICD-10-CM | POA: Diagnosis not present

## 2014-08-07 DIAGNOSIS — Z792 Long term (current) use of antibiotics: Secondary | ICD-10-CM | POA: Diagnosis not present

## 2014-08-07 MED ORDER — OXYCODONE-ACETAMINOPHEN 5-325 MG PO TABS: ORAL_TABLET | ORAL | Status: DC

## 2014-08-07 MED ORDER — DIAZEPAM 5 MG PO TABS: 5.0000 mg | ORAL_TABLET | Freq: Four times a day (QID) | ORAL | Status: DC | PRN

## 2014-08-07 MED ORDER — MORPHINE SULFATE 4 MG/ML IJ SOLN
6.0000 mg | Freq: Once | INTRAMUSCULAR | Status: AC
  Administered 2014-08-07: 6 mg via INTRAMUSCULAR
  Filled 2014-08-07: qty 2

## 2014-08-07 NOTE — Discharge Instructions (Signed)
You can take ibuprofen 800 mg every 6 hours up to a maximum of 3200 mg per day. Be sure to stay well hydrated. If this irritates her stomach reduce the dosage.  Take valium and/or percocet for breakthrough pain, do not drink alcohol, drive, care for children or perfom other critical tasks while taking valium and/or percocet.  Please follow with your primary care doctor in the next 2 days for a check-up. They must obtain records for further management.   Do not hesitate to return to the Emergency Department for any new, worsening or concerning symptoms.

## 2014-08-07 NOTE — ED Notes (Signed)
C/o low back pain with pain radiating down left leg x 1 week. Has progressively gotten worse. No recent injury. C/o injury to low back 1 year ago.

## 2014-08-07 NOTE — ED Provider Notes (Signed)
CSN: [id number]     Arrival date & time 08/07/14  1208 History   First MD Initiated Contact with Patient 08/07/14 1237     Chief Complaint  Patient presents with  . Back Pain     (Consider location/radiation/quality/duration/timing/severity/associated sxs/prior Treatment) HPI  Alex Mueller is a 54 y.o. male complaining of severe 10 out of 10 right low back pain radiating down the posterior right leg past the knee. Patient has been having pain in the leg for several weeks but significantly worsened over the last 24 hours. He's been taking 800 mg of Motrin every 6 hours at home with little relief. States pain is exacerbated by movement and palpation. Patient has history of back trouble but he has never had any radiation of the pain similar to this. Denies fever, chills, change in bowel or bladder habits, h/o IDVU or cancer, numbness or weakness.     Past Medical History  Diagnosis Date  . Hypertension   . Hyperlipidemia   . ED (erectile dysfunction)    Past Surgical History  Procedure Laterality Date  . Knee arthroscopy      right knee 1988, left knee 1998  . Foot surgery  07/05/2014   Family History  Problem Relation Age of Onset  . Healthy Mother   . Coronary artery disease Father   . Cancer Father     bladder and colon cancer  . Diabetes Father   . Coronary artery disease Paternal Uncle   . Drug abuse Maternal Grandmother   . Hypertension Neg Hx    History  Substance Use Topics  . Smoking status: Never Smoker   . Smokeless tobacco: Never Used  . Alcohol Use: No    Review of Systems  10 systems reviewed and found to be negative, except as noted in the HPI.   Allergies  Bee venom and Lisinopril  Home Medications   Prior to Admission medications   Medication Sig Start Date End Date Taking? Authorizing Provider  CIALIS 20 MG tablet TAKE 1 TABLET (20 MG TOTAL) BY MOUTH DAILY AS NEEDED FOR ERECTILE DYSFUNCTION. 06/08/14  Yes Venia Carbon, MD  EPINEPHrine  (EPIPEN) 0.3 mg/0.3 mL DEVI Inject 0.3 mLs (0.3 mg total) into the muscle once. 12/19/10  Yes Venia Carbon, MD  losartan-hydrochlorothiazide (HYZAAR) 100-25 MG per tablet TAKE 1 TABLET BY MOUTH DAILY. 06/06/14  Yes Venia Carbon, MD  amoxicillin (AMOXIL) 500 MG capsule Take 1 capsule (500 mg total) by mouth 3 (three) times daily. 07/20/14   Hendricks Limes, MD  aspirin 81 MG tablet Take 81 mg by mouth daily.      Historical Provider, MD   BP 168/83 mmHg  Pulse 74  Temp(Src) 97.9 F (36.6 C) (Oral)  Resp 18  Ht 6\' 5"  (1.956 m)  Wt 240 lb (108.863 kg)  BMI 28.45 kg/m2  SpO2 98% Physical Exam  Constitutional: He is oriented to person, place, and time. He appears well-developed and well-nourished. No distress.  HENT:  Head: Normocephalic and atraumatic.  Mouth/Throat: Oropharynx is clear and moist.  Eyes: Conjunctivae and EOM are normal. Pupils are equal, round, and reactive to light.  Neck: Normal range of motion.  Cardiovascular: Normal rate, regular rhythm and intact distal pulses.   Pulmonary/Chest: Effort normal and breath sounds normal. No stridor.  Abdominal: Soft. Bowel sounds are normal. He exhibits no distension and no mass. There is no tenderness. There is no rebound and no guarding.  Musculoskeletal: Normal range of motion.  No point tenderness to percussion of lumbar spinal processes.  No TTP or paraspinal muscular spasm. Strength is 5 out of 5 to bilateral lower extremities at hip and knee; extensor hallucis longus 5 out of 5. Ankle strength 5 out of 5, no clonus, neurovascularly intact. No saddle anaesthesia. Patellar reflexes are 2+ bilaterally.    Straight leg raise is positive on the ipsilateral side (right) at 15 and contralateral at 15.   Neurological: He is alert and oriented to person, place, and time.  Psychiatric: He has a normal mood and affect.  Nursing note and vitals reviewed.   ED Course  Procedures (including critical care time) Labs Review Labs  Reviewed - No data to display  Imaging Review No results found.   EKG Interpretation None      MDM   Final diagnoses:  Lumbar radiculopathy, acute    Filed Vitals:   08/07/14 1223 08/07/14 1225  BP:  168/83  Pulse: 74   Temp: 97.9 F (36.6 C)   TempSrc: Oral   Resp: 18   Height: 6\' 5"  (1.956 m)   Weight: 240 lb (108.863 kg)   SpO2: 98%     Medications  morphine 4 MG/ML injection 6 mg (6 mg Intramuscular Given 08/07/14 1310)    Alex Mueller is a pleasant 54 y.o. male presenting with right lumbar radiculopathy. back pain.  No neurological deficits and normal neuro exam.  Patient can walk but states is painful.  No loss of bowel or bladder control.  No concern for cauda equina.  No fever, night sweats, weight loss, h/o cancer, IVDU.  RICE protocol and pain medicine indicated and discussed with patient.   Evaluation does not show pathology that would require ongoing emergent intervention or inpatient treatment. Pt is hemodynamically stable and mentating appropriately. Discussed findings and plan with patient/guardian, who agrees with care plan. All questions answered. Return precautions discussed and outpatient follow up given.   Discharge Medication List as of 08/07/2014  1:12 PM    START taking these medications   Details  diazepam (VALIUM) 5 MG tablet Take 1 tablet (5 mg total) by mouth every 6 (six) hours as needed for anxiety (spasms)., Starting 08/07/2014, Until Discontinued, Print    oxyCODONE-acetaminophen (PERCOCET/ROXICET) 5-325 MG per tablet 1 to 2 tabs PO q6hrs  PRN for pain, Print             Monico Blitz, PA-C 08/07/14 1953  Mariea Clonts, MD 08/08/14 [phone]

## 2014-08-08 ENCOUNTER — Encounter: Payer: Self-pay | Admitting: Internal Medicine

## 2014-08-08 ENCOUNTER — Ambulatory Visit (INDEPENDENT_AMBULATORY_CARE_PROVIDER_SITE_OTHER)
Admission: RE | Admit: 2014-08-08 | Discharge: 2014-08-08 | Disposition: A | Discharge status: Home / Self Care | Payer: BC Managed Care – PPO | Source: Ambulatory Visit | Attending: Internal Medicine | Admitting: Internal Medicine

## 2014-08-08 ENCOUNTER — Ambulatory Visit (INDEPENDENT_AMBULATORY_CARE_PROVIDER_SITE_OTHER): Payer: BC Managed Care – PPO | Admitting: Internal Medicine

## 2014-08-08 VITALS — BP 148/90 | HR 64 | Temp 97.9°F | Wt 250.0 lb

## 2014-08-08 DIAGNOSIS — R110 Nausea: Secondary | ICD-10-CM

## 2014-08-08 DIAGNOSIS — M5431 Sciatica, right side: Secondary | ICD-10-CM

## 2014-08-08 DIAGNOSIS — R112 Nausea with vomiting, unspecified: Secondary | ICD-10-CM

## 2014-08-08 MED ORDER — DIAZEPAM 5 MG PO TABS: 5.0000 mg | ORAL_TABLET | Freq: Four times a day (QID) | ORAL | Status: DC | PRN

## 2014-08-08 MED ORDER — MELOXICAM 15 MG PO TABS: 15.0000 mg | ORAL_TABLET | Freq: Every day | ORAL | Status: DC | PRN

## 2014-08-08 MED ORDER — PROMETHAZINE HCL 25 MG/ML IJ SOLN
25.0000 mg | Freq: Once | INTRAMUSCULAR | Status: AC
  Administered 2014-08-08: 25 mg via INTRAMUSCULAR

## 2014-08-08 NOTE — Progress Notes (Signed)
Subjective:    Patient ID: Alex Mueller, male    DOB: [birth year], 54 y.o.   MRN: [id number]  HPI Here with wife for ED follow up of sciatica  Started with mild pain down right leg about 2 weeks ago Gradually worsened and was so bad yesterday that he could barely get out of bed Went to ED around lunch yesterday Got shot of medication there (morphine)--not really helpful  Pain worsens with turning a certain way---couldn't get comfortable in bed Leg felt weak last night---pain was so severe he had to drag his leg Has noticed some worsening with walking No weakness in leg other than from pain Some pain in posterior part of right hip  Had been taking 800mg  advil every 6 hours ---doesn't seem to have heped Has used the diazepam and oxycodone--very little help  Current Outpatient Prescriptions on File Prior to Visit  Medication Sig Dispense Refill  . aspirin 81 MG tablet Take 81 mg by mouth daily.      Marland Kitchen CIALIS 20 MG tablet TAKE 1 TABLET (20 MG TOTAL) BY MOUTH DAILY AS NEEDED FOR ERECTILE DYSFUNCTION. 3 tablet 0  . diazepam (VALIUM) 5 MG tablet Take 1 tablet (5 mg total) by mouth every 6 (six) hours as needed for anxiety (spasms). 10 tablet 0  . EPINEPHrine (EPIPEN) 0.3 mg/0.3 mL DEVI Inject 0.3 mLs (0.3 mg total) into the muscle once. 1 Device 1  . losartan-hydrochlorothiazide (HYZAAR) 100-25 MG per tablet TAKE 1 TABLET BY MOUTH DAILY. 90 tablet 0  . oxyCODONE-acetaminophen (PERCOCET/ROXICET) 5-325 MG per tablet 1 to 2 tabs PO q6hrs  PRN for pain 15 tablet 0   No current facility-administered medications on file prior to visit.    Allergies  Allergen Reactions  . Bee Venom Shortness Of Breath  . Lisinopril     REACTION: cough    Past Medical History  Diagnosis Date  . Hypertension   . Hyperlipidemia   . ED (erectile dysfunction)     Past Surgical History  Procedure Laterality Date  . Knee arthroscopy      right knee 1988, left knee 1998  . Foot surgery  07/05/2014     Family History  Problem Relation Age of Onset  . Healthy Mother   . Coronary artery disease Father   . Cancer Father     bladder and colon cancer  . Diabetes Father   . Coronary artery disease Paternal Uncle   . Drug abuse Maternal Grandmother   . Hypertension Neg Hx     History   Social History  . Marital Status: Married    Spouse Name: N/A    Number of Children: 2  . Years of Education: N/A   Occupational History  . [place] History Main Topics  . Smoking status: Never Smoker   . Smokeless tobacco: Never Used  . Alcohol Use: No  . Drug Use: No  . Sexual Activity: Not on file   Other Topics Concern  . Not on file   Social History Narrative   Review of Systems Has had low back injury at back in past--- got better with rest and meds. Last year some time No loss of bladder or bowel control    Objective:   Physical Exam  Constitutional: He appears well-developed and well-nourished. No distress.  Musculoskeletal:  No spine tenderness Almost no active back flexion Spine not tender but some pain in posterior right hip Pain elicited with hip passive  ROM SLR positive bilaterally very quickly  Neurological:  Slow antalgic gait No focal weakness          Assessment & Plan:

## 2014-08-08 NOTE — Assessment & Plan Note (Signed)
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

## 2014-08-08 NOTE — Progress Notes (Signed)
Pre visit review using our clinic review tool, if applicable. No additional management support is needed unless otherwise documented below in the visit note. 

## 2014-08-08 NOTE — Addendum Note (Signed)
Addended by: Royann Shivers A on: 08/08/2014 02:09 PM   Modules accepted: Orders

## 2014-08-09 ENCOUNTER — Encounter (HOSPITAL_COMMUNITY): Payer: Self-pay

## 2014-08-09 ENCOUNTER — Observation Stay (HOSPITAL_COMMUNITY)
Admission: EM | Admit: 2014-08-09 | Discharge: 2014-08-10 | Disposition: A | Discharge status: Home / Self Care | Payer: BC Managed Care – PPO | Attending: Internal Medicine | Admitting: Internal Medicine

## 2014-08-09 ENCOUNTER — Emergency Department (HOSPITAL_COMMUNITY): Payer: BC Managed Care – PPO

## 2014-08-09 DIAGNOSIS — Z79899 Other long term (current) drug therapy: Secondary | ICD-10-CM | POA: Insufficient documentation

## 2014-08-09 DIAGNOSIS — R61 Generalized hyperhidrosis: Secondary | ICD-10-CM | POA: Diagnosis not present

## 2014-08-09 DIAGNOSIS — Z7982 Long term (current) use of aspirin: Secondary | ICD-10-CM | POA: Diagnosis not present

## 2014-08-09 DIAGNOSIS — R42 Dizziness and giddiness: Secondary | ICD-10-CM | POA: Insufficient documentation

## 2014-08-09 DIAGNOSIS — I10 Essential (primary) hypertension: Secondary | ICD-10-CM | POA: Diagnosis not present

## 2014-08-09 DIAGNOSIS — N529 Male erectile dysfunction, unspecified: Secondary | ICD-10-CM | POA: Diagnosis not present

## 2014-08-09 DIAGNOSIS — E785 Hyperlipidemia, unspecified: Secondary | ICD-10-CM | POA: Insufficient documentation

## 2014-08-09 DIAGNOSIS — M5431 Sciatica, right side: Secondary | ICD-10-CM | POA: Diagnosis present

## 2014-08-09 DIAGNOSIS — R079 Chest pain, unspecified: Principal | ICD-10-CM | POA: Diagnosis present

## 2014-08-09 DIAGNOSIS — R0789 Other chest pain: Secondary | ICD-10-CM | POA: Diagnosis present

## 2014-08-09 LAB — CBC WITH DIFFERENTIAL/PLATELET
BASOS ABS: 0 10*3/uL (ref 0.0–0.1)
Basophils Relative: 0 % (ref 0–1)
EOS ABS: 0 10*3/uL (ref 0.0–0.7)
Eosinophils Relative: 0 % (ref 0–5)
HCT: 45.5 % (ref 39.0–52.0)
Hemoglobin: 15.2 g/dL (ref 13.0–17.0)
Lymphocytes Relative: 14 % (ref 12–46)
Lymphs Abs: 1.6 10*3/uL (ref 0.7–4.0)
MCH: 28.8 pg (ref 26.0–34.0)
MCHC: 33.4 g/dL (ref 30.0–36.0)
MCV: 86.3 fL (ref 78.0–100.0)
MONO ABS: 0.8 10*3/uL (ref 0.1–1.0)
MONOS PCT: 7 % (ref 3–12)
Neutro Abs: 9.3 10*3/uL — ABNORMAL HIGH (ref 1.7–7.7)
Neutrophils Relative %: 79 % — ABNORMAL HIGH (ref 43–77)
Platelets: 205 10*3/uL (ref 150–400)
RBC: 5.27 MIL/uL (ref 4.22–5.81)
RDW: 13.6 % (ref 11.5–15.5)
WBC: 11.7 10*3/uL — AB (ref 4.0–10.5)

## 2014-08-09 LAB — TROPONIN I

## 2014-08-09 LAB — BASIC METABOLIC PANEL
Anion gap: 6 (ref 5–15)
BUN: 12 mg/dL (ref 6–23)
CALCIUM: 9.3 mg/dL (ref 8.4–10.5)
CHLORIDE: 103 mmol/L (ref 96–112)
CO2: 29 mmol/L (ref 19–32)
CREATININE: 0.98 mg/dL (ref 0.50–1.35)
GFR calc non Af Amer: >90 mL/min (ref >90)
Glucose, Bld: 81 mg/dL (ref 70–99)
Potassium: 3.8 mmol/L (ref 3.5–5.1)
Sodium: 138 mmol/L (ref 135–145)

## 2014-08-09 LAB — CREATININE, SERUM
Creatinine, Ser: 0.98 mg/dL (ref 0.50–1.35)
GFR calc Af Amer: >90 mL/min (ref >90)
GFR calc non Af Amer: >90 mL/min (ref >90)

## 2014-08-09 LAB — I-STAT TROPONIN, ED: Troponin i, poc: 0 ng/mL (ref 0.00–0.08)

## 2014-08-09 MED ORDER — MELOXICAM 7.5 MG PO TABS: 15.0000 mg | ORAL_TABLET | Freq: Every day | ORAL | Status: DC

## 2014-08-09 MED ORDER — LOSARTAN POTASSIUM-HCTZ 100-25 MG PO TABS: 1.0000 | ORAL_TABLET | Freq: Every day | ORAL | Status: DC

## 2014-08-09 MED ORDER — PANTOPRAZOLE SODIUM 40 MG PO TBEC
40.0000 mg | DELAYED_RELEASE_TABLET | Freq: Two times a day (BID) | ORAL | Status: DC
  Administered 2014-08-09 – 2014-08-10 (×2): 40 mg via ORAL
  Filled 2014-08-09 (×3): qty 1

## 2014-08-09 MED ORDER — MORPHINE SULFATE 2 MG/ML IJ SOLN
2.0000 mg | INTRAMUSCULAR | Status: DC | PRN
  Administered 2014-08-10 (×3): 2 mg via INTRAVENOUS
  Filled 2014-08-09 (×3): qty 1

## 2014-08-09 MED ORDER — DIAZEPAM 5 MG PO TABS
5.0000 mg | ORAL_TABLET | Freq: Four times a day (QID) | ORAL | Status: DC | PRN
  Administered 2014-08-09 – 2014-08-10 (×4): 5 mg via ORAL
  Filled 2014-08-09 (×4): qty 1

## 2014-08-09 MED ORDER — HYDROCHLOROTHIAZIDE 25 MG PO TABS
25.0000 mg | ORAL_TABLET | Freq: Every day | ORAL | Status: DC
  Administered 2014-08-10: 25 mg via ORAL
  Filled 2014-08-09: qty 1

## 2014-08-09 MED ORDER — ONDANSETRON HCL 4 MG/2ML IJ SOLN: 4.0000 mg | Freq: Three times a day (TID) | INTRAMUSCULAR | Status: DC | PRN

## 2014-08-09 MED ORDER — KETOROLAC TROMETHAMINE 30 MG/ML IJ SOLN
30.0000 mg | Freq: Once | INTRAMUSCULAR | Status: AC
  Administered 2014-08-09: 30 mg via INTRAVENOUS
  Filled 2014-08-09: qty 1

## 2014-08-09 MED ORDER — ASPIRIN EC 325 MG PO TBEC
325.0000 mg | DELAYED_RELEASE_TABLET | Freq: Every day | ORAL | Status: DC
  Administered 2014-08-10: 325 mg via ORAL
  Filled 2014-08-09: qty 1

## 2014-08-09 MED ORDER — ACETAMINOPHEN 325 MG PO TABS
650.0000 mg | ORAL_TABLET | ORAL | Status: DC | PRN
Start: 2014-08-09 — End: 2014-08-10

## 2014-08-09 MED ORDER — HEPARIN SODIUM (PORCINE) 5000 UNIT/ML IJ SOLN
5000.0000 [IU] | Freq: Three times a day (TID) | INTRAMUSCULAR | Status: DC
  Filled 2014-08-09: qty 1

## 2014-08-09 MED ORDER — ASPIRIN 81 MG PO CHEW: 324.0000 mg | CHEWABLE_TABLET | Freq: Once | ORAL | Status: DC

## 2014-08-09 MED ORDER — LOSARTAN POTASSIUM 50 MG PO TABS
100.0000 mg | ORAL_TABLET | Freq: Every day | ORAL | Status: DC
  Administered 2014-08-10: 100 mg via ORAL
  Filled 2014-08-09: qty 2

## 2014-08-09 MED ORDER — ONDANSETRON HCL 4 MG/2ML IJ SOLN: 4.0000 mg | Freq: Four times a day (QID) | INTRAMUSCULAR | Status: DC | PRN

## 2014-08-09 NOTE — ED Notes (Signed)
Patient transported to X-ray 

## 2014-08-09 NOTE — ED Provider Notes (Signed)
CSN: [id number]     Arrival date & time 08/09/14  1119 History   First MD Initiated Contact with Patient 08/09/14 1124     Chief Complaint  Patient presents with  . Chest Pain     (Consider location/radiation/quality/duration/timing/severity/associated sxs/prior Treatment) HPI Alex Mueller is a 54 y.o. male  With history of hypertension, presents to emergency department complaining of chest pain. Patient states chest pain started while sitting in a recliner at home. Pain is across entire chest. No radiation. States got diaphoretic. Denies any shortness of breath. States he did feel dizzy. No history of the same. No prior cardiac problems. No prior cardiac workup. States his cholesterol is currently normal, he does take vacations for his blood pressure. She is not a smoker. Family history of heart problems in his father but at age of 73. Patient called EMS, was given aspirin 325 mg. States his pain only lasted approximately 10 minutes and resolved since then. This episode started approximately an hour ago.  Past Medical History  Diagnosis Date  . Hypertension   . Hyperlipidemia   . ED (erectile dysfunction)    Past Surgical History  Procedure Laterality Date  . Knee arthroscopy      right knee 1988, left knee 1998  . Foot surgery  07/05/2014   Family History  Problem Relation Age of Onset  . Healthy Mother   . Coronary artery disease Father   . Cancer Father     bladder and colon cancer  . Diabetes Father   . Coronary artery disease Paternal Uncle   . Drug abuse Maternal Grandmother   . Hypertension Neg Hx    History  Substance Use Topics  . Smoking status: Never Smoker   . Smokeless tobacco: Never Used  . Alcohol Use: No    Review of Systems  Constitutional: Negative for fever and chills.  Respiratory: Positive for chest tightness. Negative for cough and shortness of breath.   Cardiovascular: Positive for chest pain. Negative for palpitations and leg swelling.   Gastrointestinal: Negative for nausea, vomiting, abdominal pain, diarrhea and abdominal distention.  Musculoskeletal: Negative for myalgias, arthralgias, neck pain and neck stiffness.  Skin: Negative for rash.  Allergic/Immunologic: Negative for immunocompromised state.  Neurological: Negative for dizziness, weakness, light-headedness, numbness and headaches.  All other systems reviewed and are negative.     Allergies  Bee venom; Lisinopril; and Oxycodone  Home Medications   Prior to Admission medications   Medication Sig Start Date End Date Taking? Authorizing Provider  aspirin 81 MG tablet Take 81 mg by mouth daily.      Historical Provider, MD  CIALIS 20 MG tablet TAKE 1 TABLET (20 MG TOTAL) BY MOUTH DAILY AS NEEDED FOR ERECTILE DYSFUNCTION. 06/08/14   Venia Carbon, MD  diazepam (VALIUM) 5 MG tablet Take 1 tablet (5 mg total) by mouth every 6 (six) hours as needed for muscle spasms (spasms). 08/08/14   Venia Carbon, MD  EPINEPHrine (EPIPEN) 0.3 mg/0.3 mL DEVI Inject 0.3 mLs (0.3 mg total) into the muscle once. 12/19/10   Venia Carbon, MD  losartan-hydrochlorothiazide (HYZAAR) 100-25 MG per tablet TAKE 1 TABLET BY MOUTH DAILY. 06/06/14   Venia Carbon, MD  meloxicam (MOBIC) 15 MG tablet Take 1 tablet (15 mg total) by mouth daily as needed for pain. 08/08/14   Venia Carbon, MD   BP 124/67 mmHg  Pulse 71  Temp(Src) 98.2 F (36.8 C) (Oral)  Resp 20  Ht 6\' 5"  (1.956  m)  Wt 250 lb (113.399 kg)  BMI 29.64 kg/m2  SpO2 99% Physical Exam  Constitutional: He is oriented to person, place, and time. He appears well-developed and well-nourished. No distress.  HENT:  Head: Normocephalic and atraumatic.  Eyes: Conjunctivae are normal.  Neck: Neck supple.  Cardiovascular: Normal rate, regular rhythm and normal heart sounds.   Pulmonary/Chest: Effort normal. No respiratory distress. He has no wheezes. He has no rales. He exhibits no tenderness.  Abdominal: Soft. Bowel  sounds are normal. He exhibits no distension. There is no tenderness. There is no rebound.  Musculoskeletal: He exhibits no edema.  Neurological: He is alert and oriented to person, place, and time.  Skin: Skin is warm and dry. He is not diaphoretic.  Nursing note and vitals reviewed.   ED Course  Procedures (including critical care time) Labs Review Labs Reviewed  CBC WITH DIFFERENTIAL/PLATELET  BASIC METABOLIC PANEL  I-STAT [place], ED    Imaging Review Dg Lumbar Spine Complete  08/08/2014   CLINICAL DATA:  Initial encounter for Severe sciatica and hip pain.  EXAM: LUMBAR SPINE - COMPLETE 4+ VIEW  COMPARISON:  None.  FINDINGS: Five lumbar type vertebral bodies. Sacroiliac joints are symmetric. Maintenance of vertebral body height and alignment. Moderate to marked loss of intervertebral disc height at the lumbosacral junction.  IMPRESSION: Degenerative disc disease at the lumbosacral junction.   Electronically Signed   By: Abigail Miyamoto M.D.   On: 08/08/2014 13:56   Dg Hip Unilat With Pelvis 2-3 Views Right  08/08/2014   CLINICAL DATA:  Initial encounter for severe sciatica and hip pain. Left-sided.  EXAM: DG HIP W/ PELVIS 2-3V*R*  COMPARISON:  None.  FINDINGS: Femoral heads are located. Imaged sacroiliac joints are symmetric. Minimal bilateral weight-bearing hip joint space narrowing. No acute fracture. No focal osseous lesion.  IMPRESSION: Minimal bilateral hip osteoarthritis.   Electronically Signed   By: Abigail Miyamoto M.D.   On: 08/08/2014 13:58     EKG Interpretation   Date/Time:  Tuesday August 09 2014 11:27:21 EST Ventricular Rate:  80 PR Interval:  154 QRS Duration: 95 QT Interval:  395 QTC Calculation: 456 R Axis:   32 Text Interpretation:  Sinus rhythm No previous tracing Confirmed by  Maryan Rued  MD, Loree Fee ([id number]) on 08/09/2014 11:46:42 AM      MDM   Final diagnoses:  Chest pain, unspecified chest pain type    Patient with episode of chest pain about an hour  ago, lasted 10 minutes, started while at rest. Associated dizziness and diaphoresis. He is currently pain-free. Vital signs are normal. Patient nontoxic appearing. Will get EKG, labs, chest x-ray.  Labs unremarkable. CXR negative. Pt has positive family hx of heart disease, states cousins his age has had MIs. Also htn and hyperlipidimia. Will admit for rule out and futher treatment. Pt agreeable to admission. Discussed with [facility], will obs.   Filed Vitals:   08/10/14 1321 08/10/14 1324 08/10/14 1326 08/10/14 1328  BP: 159/84 212/92 204/83 177/94  Pulse:      Temp:      TempSrc:      Resp:   24   Height:      Weight:      SpO2:         Renold Genta, PA-C 08/10/14 [place], MD 08/10/14 1515

## 2014-08-09 NOTE — ED Notes (Signed)
Meal ordered for patient.

## 2014-08-09 NOTE — ED Notes (Signed)
Per [facility]: pt. Is from home. Complaint of sudden onset of central CP. Pt. Denies radiation. Endorses lightheadedness/dizziness during episode as well as diaphoresis. 324 ASA given by EMS. EMS denies orthostatic changes for pt. No CP/SOB at this time.

## 2014-08-09 NOTE — H&P (Signed)
[facility] History and Physical  Alex Mueller:[id number] DOB: [birth year] DOA: 08/09/2014  Referring physician: Dr. Maryan Rued PCP: Viviana Simpler, MD   Chief Complaint: Chest pain  HPI: Alex Mueller is a 54 y.o. male with past medical history of hypertension that comes in for chest pain that started on the day of admission he relates he was sitting down when he got this pain got sweaty fit a little bit dizzy but no shortness of breath. He relates nothing makes it better or worse. He was started recently on mobic for back pain and he has been taking this for the last 2 days. He denies any black stools, red stools, fever, shortness of breath nausea or vomiting.  In the ED: 1st set  cardiac enzymes are negative EKG shows normal sinus rhythm with nonspecific T-wave changes were consulted for further evaluation.   Review of Systems:  Constitutional:  No weight loss, night sweats, Fevers, chills, fatigue.  HEENT:  No headaches, Difficulty swallowing,Tooth/dental problems,Sore throat,  No sneezing, itching, ear ache, nasal congestion, post nasal drip,  Cardio-vascular:   Orthopnea, PND, swelling in lower extremities, anasarca, dizziness, palpitations  GI:  No heartburn, indigestion, abdominal pain, nausea, vomiting, diarrhea, change in bowel habits, loss of appetite  Resp:  No shortness of breath with exertion or at rest. No excess mucus, no productive cough, No non-productive cough, No coughing up of blood.No change in color of mucus.No wheezing.No chest wall deformity  Skin:  no rash or lesions.  GU:  no dysuria, change in color of urine, no urgency or frequency. No flank pain.  Musculoskeletal:  No joint pain or swelling. No decreased range of motion. No back pain.  Psych:  No change in mood or affect. No depression or anxiety. No memory loss.   Past Medical History  Diagnosis Date  . Hypertension   . Hyperlipidemia   . ED (erectile dysfunction)    Past Surgical  History  Procedure Laterality Date  . Knee arthroscopy      right knee 1988, left knee 1998  . Foot surgery  07/05/2014   Social History:  reports that he has never smoked. He has never used smokeless tobacco. He reports that he does not drink alcohol or use illicit drugs.  Allergies  Allergen Reactions  . Bee Venom Shortness Of Breath  . Lisinopril     REACTION: cough  . Oxycodone Nausea And Vomiting    Family History  Problem Relation Age of Onset  . Healthy Mother   . Coronary artery disease Father   . Cancer Father     bladder and colon cancer  . Diabetes Father   . Coronary artery disease Paternal Uncle   . Drug abuse Maternal Grandmother   . Hypertension Neg Hx    Prior to Admission medications   Medication Sig Start Date End Date Taking? Authorizing Provider  aspirin 81 MG tablet Take 81 mg by mouth daily.      Historical Provider, MD  CIALIS 20 MG tablet TAKE 1 TABLET (20 MG TOTAL) BY MOUTH DAILY AS NEEDED FOR ERECTILE DYSFUNCTION. 06/08/14   Venia Carbon, MD  diazepam (VALIUM) 5 MG tablet Take 1 tablet (5 mg total) by mouth every 6 (six) hours as needed for muscle spasms (spasms). 08/08/14   Venia Carbon, MD  EPINEPHrine (EPIPEN) 0.3 mg/0.3 mL DEVI Inject 0.3 mLs (0.3 mg total) into the muscle once. 12/19/10   Venia Carbon, MD  losartan-hydrochlorothiazide (HYZAAR) 100-25 MG per tablet  TAKE 1 TABLET BY MOUTH DAILY. 06/06/14   Venia Carbon, MD  meloxicam (MOBIC) 15 MG tablet Take 1 tablet (15 mg total) by mouth daily as needed for pain. 08/08/14   Venia Carbon, MD   Physical Exam: Filed Vitals:   08/09/14 1128 08/09/14 1130 08/09/14 1200 08/09/14 1341  BP:  131/67 124/67 128/79  Pulse:  73 71 69  Temp:  98.2 F (36.8 C)    TempSrc:  Oral    Resp:  18 20 18   Height: 6\' 5"  (1.956 m)     Weight: 113.399 kg (250 lb)     SpO2:  99% 99% 99%    Wt Readings from Last 3 Encounters:  08/09/14 113.399 kg (250 lb)  08/08/14 113.399 kg (250 lb)    08/07/14 108.863 kg (240 lb)    General:  Appears calm and comfortable Eyes: PERRL, normal lids, irises & conjunctiva ENT: grossly normal hearing, lips & tongue Neck: no LAD, masses or thyromegaly Cardiovascular: RRR, no m/r/g. No LE edema. Telemetry: SR, no arrhythmias  Respiratory: CTA bilaterally, no w/r/r. Normal respiratory effort. Abdomen: soft, no epigastric tenderness or rebound guarding Skin: no rash or induration seen on limited exam Musculoskeletal: grossly normal tone BUE/BLE Psychiatric: grossly normal mood and affect, speech fluent and appropriate Neurologic: grossly non-focal.          Labs on Admission:  Basic Metabolic Panel:  Recent Labs Lab 08/09/14 1150  NA 138  K 3.8  CL 103  CO2 29  GLUCOSE 81  BUN 12  CREATININE 0.98  CALCIUM 9.3   Liver Function Tests: No results for input(s): AST, ALT, ALKPHOS, BILITOT, PROT, ALBUMIN in the last 168 hours. No results for input(s): LIPASE, AMYLASE in the last 168 hours. No results for input(s): AMMONIA in the last 168 hours. CBC:  Recent Labs Lab 08/09/14 1150  WBC 11.7*  NEUTROABS 9.3*  HGB 15.2  HCT 45.5  MCV 86.3  PLT 205   Cardiac Enzymes: No results for input(s): CKTOTAL, CKMB, CKMBINDEX, TROPONINI in the last 168 hours.  BNP (last 3 results) No results for input(s): PROBNP in the last 8760 hours. CBG: No results for input(s): GLUCAP in the last 168 hours.  Radiological Exams on Admission: Dg Chest 2 View  08/09/2014   CLINICAL DATA:  Acute chest pain.  EXAM: CHEST  2 VIEW  COMPARISON:  December 31, 2009.  FINDINGS: The heart size and mediastinal contours are within normal limits. Both lungs are clear. No pneumothorax or pleural effusion is noted. The visualized skeletal structures are unremarkable.  IMPRESSION: No acute cardiopulmonary abnormality seen.   Electronically Signed   By: Sabino Dick M.D.   On: 08/09/2014 12:50   Dg Lumbar Spine Complete  08/08/2014   CLINICAL DATA:  Initial  encounter for Severe sciatica and hip pain.  EXAM: LUMBAR SPINE - COMPLETE 4+ VIEW  COMPARISON:  None.  FINDINGS: Five lumbar type vertebral bodies. Sacroiliac joints are symmetric. Maintenance of vertebral body height and alignment. Moderate to marked loss of intervertebral disc height at the lumbosacral junction.  IMPRESSION: Degenerative disc disease at the lumbosacral junction.   Electronically Signed   By: Abigail Miyamoto M.D.   On: 08/08/2014 13:56   Dg Hip Unilat With Pelvis 2-3 Views Right  08/08/2014   CLINICAL DATA:  Initial encounter for severe sciatica and hip pain. Left-sided.  EXAM: DG HIP W/ PELVIS 2-3V*R*  COMPARISON:  None.  FINDINGS: Femoral heads are located. Imaged sacroiliac joints are symmetric.  Minimal bilateral weight-bearing hip joint space narrowing. No acute fracture. No focal osseous lesion.  IMPRESSION: Minimal bilateral hip osteoarthritis.   Electronically Signed   By: Abigail Miyamoto M.D.   On: 08/08/2014 13:58    EKG: Independently reviewed. Sinus rhythm normal axis and nonspecific T-wave changes.  Assessment/Plan Atypical Chest pain: - Heart scores 2-3, I agree with bringing him into the hospital under observation and monitoring on telemetry. - I will cycle his cardiac enzymes, get a 2-D echo percent of cardiac enzymes were negative 1. - Seems most likely gastritis/peptic ulcer disease at this pain started after he started taking his mobile. - Place him on protonic by mouth twice a day. He denies any black stools or red stools. - Chest x-ray does not show any acute disease.  Essential hypertension: - Continue home medications no changes were made.    Code Status: full DVT Prophylaxis:SCD'd Family Communication: none  Disposition Plan: inpatient  Time spent: 65 minutes  FELIZ Marguarite Arbour [facility] Pager [phone]

## 2014-08-10 ENCOUNTER — Observation Stay (HOSPITAL_COMMUNITY): Payer: BC Managed Care – PPO

## 2014-08-10 ENCOUNTER — Encounter: Payer: Self-pay | Admitting: Internal Medicine

## 2014-08-10 DIAGNOSIS — R072 Precordial pain: Secondary | ICD-10-CM

## 2014-08-10 DIAGNOSIS — R079 Chest pain, unspecified: Secondary | ICD-10-CM

## 2014-08-10 MED ORDER — PROMETHAZINE HCL 12.5 MG PO TABS: 12.5000 mg | ORAL_TABLET | Freq: Four times a day (QID) | ORAL | Status: DC | PRN

## 2014-08-10 MED ORDER — PANTOPRAZOLE SODIUM 40 MG PO TBEC: 40.0000 mg | DELAYED_RELEASE_TABLET | Freq: Every day | ORAL | Status: DC

## 2014-08-10 MED ORDER — TECHNETIUM TC 99M SESTAMIBI GENERIC - CARDIOLITE
30.0000 | Freq: Once | INTRAVENOUS | Status: AC | PRN
  Administered 2014-08-10: 30 via INTRAVENOUS

## 2014-08-10 MED ORDER — GABAPENTIN 300 MG PO CAPS: ORAL_CAPSULE | ORAL | Status: DC

## 2014-08-10 MED ORDER — TECHNETIUM TC 99M SESTAMIBI GENERIC - CARDIOLITE
10.0000 | Freq: Once | INTRAVENOUS | Status: AC | PRN
  Administered 2014-08-10: 10 via INTRAVENOUS

## 2014-08-10 MED ORDER — HYDROMORPHONE HCL 2 MG PO TABS: 2.0000 mg | ORAL_TABLET | Freq: Four times a day (QID) | ORAL | Status: DC | PRN

## 2014-08-10 MED ORDER — METHYLPREDNISOLONE 4 MG PO KIT: PACK | ORAL | Status: DC

## 2014-08-10 NOTE — Consult Note (Signed)
Admit date: 08/09/2014 Referring Physician  Dr. Conley Canal Primary Physician  Dr. Viviana Simpler Primary Cardiologist  None Reason for Consultation  Chest pain  HPI: Alex Mueller is a 54 y.o. male with past medical history of hypertension that comes in for chest pain that started yesterday while he was sitting down. He has had severe sciatica for several days and has been taking mobic.  He says that he had not eaten much before this had started.  It was a pressure that went across his chest with no radiation into his arms or neck. I lasted about 10 minutes and resolved on its own.   He became diaphoretic with the pain and dizzy  but no shortness of breath. Nothing made it better or worse. He denies any black stools, red stools, fever, shortness of breath nausea or vomiting.  He denies any history of exertional CP or DOE.  Cardiology is now asked to consult for evaluation of CP.      PMH:   Past Medical History  Diagnosis Date  . Hypertension   . Hyperlipidemia   . ED (erectile dysfunction)      PSH:   Past Surgical History  Procedure Laterality Date  . Knee arthroscopy      right knee 1988, left knee 1998  . Foot surgery  07/05/2014    Allergies:  Bee venom; Lisinopril; and Oxycodone Prior to Admit Meds:   Prescriptions prior to admission  Medication Sig Dispense Refill Last Dose  . aspirin 81 MG tablet Take 81 mg by mouth daily.     Taking  . CIALIS 20 MG tablet TAKE 1 TABLET (20 MG TOTAL) BY MOUTH DAILY AS NEEDED FOR ERECTILE DYSFUNCTION. 3 tablet 0 Taking  . diazepam (VALIUM) 5 MG tablet Take 1 tablet (5 mg total) by mouth every 6 (six) hours as needed for muscle spasms (spasms). 30 tablet 0   . EPINEPHrine (EPIPEN) 0.3 mg/0.3 mL DEVI Inject 0.3 mLs (0.3 mg total) into the muscle once. 1 Device 1 Taking  . losartan-hydrochlorothiazide (HYZAAR) 100-25 MG per tablet TAKE 1 TABLET BY MOUTH DAILY. 90 tablet 0 Taking  . meloxicam (MOBIC) 15 MG tablet Take 1 tablet (15 mg total) by  mouth daily as needed for pain. 30 tablet 1    Fam HX:    Family History  Problem Relation Age of Onset  . Healthy Mother   . Coronary artery disease Father   . Cancer Father     bladder and colon cancer  . Diabetes Father   . Coronary artery disease Paternal Uncle   . Drug abuse Maternal Grandmother   . Hypertension Neg Hx    Social HX:    History   Social History  . Marital Status: Married    Spouse Name: N/A    Number of Children: 2  . Years of Education: N/A   Occupational History  . [employer] History Main Topics  . Smoking status: Never Smoker   . Smokeless tobacco: Never Used  . Alcohol Use: No  . Drug Use: No  . Sexual Activity: Yes   Other Topics Concern  . Not on file   Social History Narrative     ROS:  All 11 ROS were addressed and are negative except what is stated in the HPI  Physical Exam: Blood pressure 125/71, pulse 62, temperature 98 F (36.7 C), temperature source Oral, resp. rate 18, height 6\' 5"  (1.956 m), weight 241 lb  13.5 oz (109.7 kg), SpO2 99 %.    General: Well developed, well nourished, in no acute distress Head: Eyes PERRLA, No xanthomas.   Normal cephalic and atramatic  Lungs:   Clear bilaterally to auscultation and percussion. Heart:   HRRR S1 S2 Pulses are 2+ & equal.            No carotid bruit. No JVD.  No abdominal bruits. No femoral bruits. Abdomen: Bowel sounds are positive, abdomen soft and non-tender without masses  Extremities:   No clubbing, cyanosis or edema.  DP +1 Neuro: Alert and oriented X 3. Psych:  Good affect, responds appropriately    Labs:   Lab Results  Component Value Date   WBC 11.7* 08/09/2014   HGB 15.2 08/09/2014   HCT 45.5 08/09/2014   MCV 86.3 08/09/2014   PLT 205 08/09/2014    Recent Labs Lab 08/09/14 1150 08/09/14 1450  NA 138  --   K 3.8  --   CL 103  --   CO2 29  --   BUN 12  --   CREATININE 0.98 0.98  CALCIUM 9.3  --   GLUCOSE 81  --     No results found for: PTT No results found for: INR, PROTIME Lab Results  Component Value Date   TROPONINI <0.03 08/09/2014     Lab Results  Component Value Date   CHOL 186 03/22/2014   CHOL 184 01/28/2012   CHOL 193 12/19/2010   Lab Results  Component Value Date   HDL 40.60 03/22/2014   HDL 46.50 01/28/2012   HDL 43.50 12/19/2010   Lab Results  Component Value Date   LDLCALC 122* 03/22/2014   LDLCALC 119* 01/28/2012   LDLCALC 132* 12/19/2010   Lab Results  Component Value Date   TRIG 119.0 03/22/2014   TRIG 91.0 01/28/2012   TRIG 90.0 12/19/2010   Lab Results  Component Value Date   CHOLHDL 5 03/22/2014   CHOLHDL 4 01/28/2012   CHOLHDL 4 12/19/2010   Lab Results  Component Value Date   LDLDIRECT 131.1 01/05/2007      Radiology:  Dg Chest 2 View  08/09/2014   CLINICAL DATA:  Acute chest pain.  EXAM: CHEST  2 VIEW  COMPARISON:  December 31, 2009.  FINDINGS: The heart size and mediastinal contours are within normal limits. Both lungs are clear. No pneumothorax or pleural effusion is noted. The visualized skeletal structures are unremarkable.  IMPRESSION: No acute cardiopulmonary abnormality seen.   Electronically Signed   By: Sabino Dick M.D.   On: 08/09/2014 12:50   Dg Lumbar Spine Complete  08/08/2014   CLINICAL DATA:  Initial encounter for Severe sciatica and hip pain.  EXAM: LUMBAR SPINE - COMPLETE 4+ VIEW  COMPARISON:  None.  FINDINGS: Five lumbar type vertebral bodies. Sacroiliac joints are symmetric. Maintenance of vertebral body height and alignment. Moderate to marked loss of intervertebral disc height at the lumbosacral junction.  IMPRESSION: Degenerative disc disease at the lumbosacral junction.   Electronically Signed   By: Abigail Miyamoto M.D.   On: 08/08/2014 13:56   Dg Hip Unilat With Pelvis 2-3 Views Right  08/08/2014   CLINICAL DATA:  Initial encounter for severe sciatica and hip pain. Left-sided.  EXAM: DG HIP W/ PELVIS 2-3V*R*  COMPARISON:  None.   FINDINGS: Femoral heads are located. Imaged sacroiliac joints are symmetric. Minimal bilateral weight-bearing hip joint space narrowing. No acute fracture. No focal osseous lesion.  IMPRESSION: Minimal bilateral hip osteoarthritis.  Electronically Signed   By: Abigail Miyamoto M.D.   On: 08/08/2014 13:58    EKG:  NSR with nonspecific T wave abnormality  ASSESSMENT/Plan::  1.  Chest pain with typical and atypical features.  The pain was across his chest and pressure but there was no radiation and no nausea or SOB.  He had not eaten much and thought it may be indigestion.  He also has been taking Mobic for back pain the past few days so this could be GI related.  EKG has nonspecific T wave abnormality.  Troponin neg x 2.  Chest xray ok.  His CRF include age > 11, male sex, family history of CAD and HTN.  He is NPO and will set up Nuclear stress test to rule out ischemia.  Will check 2D echo to assess LVF. 2.  HTN - controlled.  Continue Losartan HCT   Sueanne Margarita, MD  08/10/2014  9:19 AM

## 2014-08-10 NOTE — Progress Notes (Signed)
UR completed 

## 2014-08-10 NOTE — Progress Notes (Addendum)
  Echocardiogram 2D Echocardiogram has been performed.  Marygrace Drought M 08/10/2014, 11:41 AM

## 2014-08-10 NOTE — Discharge Summary (Signed)
Physician Discharge Summary  Alex Mueller QVZ:[id number] DOB: Jan 25, 1961 DOA: 08/09/2014  PCP: Viviana Simpler, MD  Admit date: 08/09/2014 Discharge date: 08/10/2014  Discharge Diagnoses:  Principal Problem:   Chest pain Active Problems:   Essential hypertension   Right sided sciatica   Atypical chest pain   Discharge Condition: stable  Filed Weights   08/09/14 1128 08/09/14 1551 08/10/14 0400  Weight: 113.399 kg (250 lb) 109.725 kg (241 lb 14.4 oz) 109.7 kg (241 lb 13.5 oz)    History of present illness:  54 y.o. male with past medical history of hypertension that comes in for chest pain that started on the day of admission he relates he was sitting down when he got this pain got sweaty fit a little bit dizzy but no shortness of breath. He relates nothing makes it better or worse. He was started recently on mobic for back pain and he has been taking this for the last 2 days. He denies any black stools, red stools, fever, shortness of breath nausea or vomiting.  In the ED: 1st set cardiac enzymes are negative EKG shows normal sinus rhythm with nonspecific T-wave changes were consulted for further evaluation.  Hospital Course:  Admitted to telemetry. MI ruled out.  Cardiology consulted and ordered Myoview.  Myoview was low risk without ischemia, normal EF.   Patient continued complain about sciatica pain.  Outpatient records reviewed.  Started on neurontin. Will need outpatient f/u.  Patient without weakness, bowel or bladder incontinence.  Procedures:  none  Consultations:  [facility]  Discharge Exam: Filed Vitals:   08/10/14 1445  BP: 129/76  Pulse: 85  Temp: 98.6 F (37 C)  Resp: 20    General: comfortable. Walking around Cardiovascular: RRR Respiratory: CTA Ext: positive SLR  Discharge Instructions   Discharge Instructions    Activity as tolerated - No restrictions    Complete by:  As directed      Diet - low sodium heart healthy    Complete by:  As  directed           Current Discharge Medication List    START taking these medications   Details  gabapentin (NEURONTIN) 300 MG capsule 1 tablet nightly for 2 nights, then 1 tablet twice daily for 2 days, then 1 tablet 3 times daily for nerve pain Qty: 90 capsule, Refills: 0    HYDROmorphone (DILAUDID) 2 MG tablet Take 1-2 tablets (2-4 mg total) by mouth every 6 (six) hours as needed for severe pain. Qty: 30 tablet, Refills: 0    methylPREDNISolone (MEDROL DOSEPAK) 4 MG tablet follow package directions Qty: 21 tablet, Refills: 0    pantoprazole (PROTONIX) 40 MG tablet Take 1 tablet (40 mg total) by mouth daily. For 2 weeks Qty: 14 tablet, Refills: 0    promethazine (PHENERGAN) 12.5 MG tablet Take 1 tablet (12.5 mg total) by mouth every 6 (six) hours as needed for nausea or vomiting. Qty: 10 tablet, Refills: 0      CONTINUE these medications which have NOT CHANGED   Details  EPINEPHrine (EPIPEN) 0.3 mg/0.3 mL DEVI Inject 0.3 mLs (0.3 mg total) into the muscle once. Qty: 1 Device, Refills: 1    losartan-hydrochlorothiazide (HYZAAR) 100-25 MG per tablet TAKE 1 TABLET BY MOUTH DAILY. Qty: 90 tablet, Refills: 0    CIALIS 20 MG tablet TAKE 1 TABLET (20 MG TOTAL) BY MOUTH DAILY AS NEEDED FOR ERECTILE DYSFUNCTION. Qty: 3 tablet, Refills: 0    diazepam (VALIUM) 5 MG tablet Take  1 tablet (5 mg total) by mouth every 6 (six) hours as needed for muscle spasms (spasms). Qty: 30 tablet, Refills: 0      STOP taking these medications     ibuprofen (ADVIL,MOTRIN) 200 MG tablet      meloxicam (MOBIC) 15 MG tablet        Allergies  Allergen Reactions  . Bee Venom Shortness Of Breath  . Lisinopril     REACTION: cough  . Oxycodone Nausea And Vomiting   Follow-up Information    Follow up with Viviana Simpler, MD In 2 weeks.   Specialties:  Internal Medicine, Pediatrics   Contact information:   [address] [place] [address] [phone]        The results of  significant diagnostics from this hospitalization (including imaging, microbiology, ancillary and laboratory) are listed below for reference.    Significant Diagnostic Studies: Dg Chest 2 View  08/09/2014   CLINICAL DATA:  Acute chest pain.  EXAM: CHEST  2 VIEW  COMPARISON:  December 31, 2009.  FINDINGS: The heart size and mediastinal contours are within normal limits. Both lungs are clear. No pneumothorax or pleural effusion is noted. The visualized skeletal structures are unremarkable.  IMPRESSION: No acute cardiopulmonary abnormality seen.   Electronically Signed   By: Sabino Dick M.D.   On: 08/09/2014 12:50   Dg Lumbar Spine Complete  08/08/2014   CLINICAL DATA:  Initial encounter for Severe sciatica and hip pain.  EXAM: LUMBAR SPINE - COMPLETE 4+ VIEW  COMPARISON:  None.  FINDINGS: Five lumbar type vertebral bodies. Sacroiliac joints are symmetric. Maintenance of vertebral body height and alignment. Moderate to marked loss of intervertebral disc height at the lumbosacral junction.  IMPRESSION: Degenerative disc disease at the lumbosacral junction.   Electronically Signed   By: Abigail Miyamoto M.D.   On: 08/08/2014 13:56   Nm Myocar Multi W/spect W/wall Motion / Ef  08/10/2014   CLINICAL DATA:  54 y.o. male with past medical history of hypertension that comes in for chest pain that started while he was sitting down  EXAM: MYOCARDIAL IMAGING WITH SPECT (REST AND EXERCISE)  GATED LEFT VENTRICULAR WALL MOTION STUDY  LEFT VENTRICULAR EJECTION FRACTION  TECHNIQUE: Standard myocardial SPECT imaging was performed after resting intravenous injection of 10 mCi Tc-82m sestamibi. Subsequently, exercise tolerance test was performed by the patient under the supervision of the Cardiology staff. At peak-stress, 30 mCi Tc-46m sestamibi was injected intravenously and standard myocardial SPECT imaging was performed. Quantitative gated imaging was also performed to evaluate left ventricular wall motion, and estimate left  ventricular ejection fraction.  COMPARISON:  None.  FINDINGS: Perfusion: No decreased activity in the left ventricle on stress imaging to suggest reversible ischemia or infarction.  Wall Motion: Normal left ventricular wall motion. No left ventricular dilation.  Left Ventricular Ejection Fraction: 65 %  End diastolic volume 95 ml  End systolic volume 33 ml  IMPRESSION: 1. No reversible ischemia or infarction.  2. Normal left ventricular wall motion.  3. Left ventricular ejection fraction 65%  4. Low-risk stress test findings*.  *2012 Appropriate Use Criteria for Coronary Revascularization Focused Update: J Am Coll Cardiol. 6203;55(9):741-638. http://content.[email or web address].aspx?articleid=[email or web address]   Electronically Signed   By: Sanda Klein   On: 08/10/2014 15:12   Dg Hip Unilat With Pelvis 2-3 Views Right  08/08/2014   CLINICAL DATA:  Initial encounter for severe sciatica and hip pain. Left-sided.  EXAM: DG HIP W/ PELVIS 2-3V*R*  COMPARISON:  None.  FINDINGS:  Femoral heads are located. Imaged sacroiliac joints are symmetric. Minimal bilateral weight-bearing hip joint space narrowing. No acute fracture. No focal osseous lesion.  IMPRESSION: Minimal bilateral hip osteoarthritis.   Electronically Signed   By: Abigail Miyamoto M.D.   On: 08/08/2014 13:58   Echo Left ventricle: The cavity size was normal. Wall thickness was normal. Systolic function was normal. The estimated ejection fraction was in the range of 55% to 60%. Wall motion was normal; there were no regional wall motion abnormalities. Left ventricular diastolic function parameters were normal. - Ascending aorta: The ascending aorta was mildly dilated. - Mitral valve: There was trivial regurgitation. - Atrial septum: No defect or patent foramen ovale was identified. - Pulmonic valve: There was trivial regurgitation.  Microbiology: No results found for this or any previous visit (from the past 240 hour(s)).   Labs: Basic  Metabolic Panel:  Recent Labs Lab 08/09/14 1150 08/09/14 1450  NA 138  --   K 3.8  --   CL 103  --   CO2 29  --   GLUCOSE 81  --   BUN 12  --   CREATININE 0.98 0.98  CALCIUM 9.3  --    Liver Function Tests: No results for input(s): AST, ALT, ALKPHOS, BILITOT, PROT, ALBUMIN in the last 168 hours. No results for input(s): LIPASE, AMYLASE in the last 168 hours. No results for input(s): AMMONIA in the last 168 hours. CBC:  Recent Labs Lab 08/09/14 1150  WBC 11.7*  NEUTROABS 9.3*  HGB 15.2  HCT 45.5  MCV 86.3  PLT 205   Cardiac Enzymes:  Recent Labs Lab 08/09/14 1450  TROPONINI <0.03   BNP: BNP (last 3 results) No results for input(s): PROBNP in the last 8760 hours. CBG: No results for input(s): GLUCAP in the last 168 hours.     SignedDelfina Redwood  [facility] 08/10/2014, 4:57 PM

## 2014-08-10 NOTE — Progress Notes (Signed)
To whom it may concern:   Alex Mueller is unable to work 08/08/14 - 08/22/14 due to medical condition.    Sincerely,    Doree Barthel, MD [facility]

## 2014-08-15 ENCOUNTER — Ambulatory Visit (INDEPENDENT_AMBULATORY_CARE_PROVIDER_SITE_OTHER): Payer: BC Managed Care – PPO | Admitting: Internal Medicine

## 2014-08-15 ENCOUNTER — Encounter: Payer: Self-pay | Admitting: Internal Medicine

## 2014-08-15 ENCOUNTER — Telehealth: Payer: Self-pay | Admitting: Internal Medicine

## 2014-08-15 VITALS — BP 142/86 | HR 78 | Temp 98.5°F | Resp 14 | Ht 77.0 in | Wt 247.0 lb

## 2014-08-15 DIAGNOSIS — Z7689 Persons encountering health services in other specified circumstances: Secondary | ICD-10-CM

## 2014-08-15 DIAGNOSIS — M5431 Sciatica, right side: Secondary | ICD-10-CM

## 2014-08-15 DIAGNOSIS — R0789 Other chest pain: Secondary | ICD-10-CM

## 2014-08-15 NOTE — Assessment & Plan Note (Signed)
May have been GI but not clear cut Stress test negative Reviewed hospital records

## 2014-08-15 NOTE — Assessment & Plan Note (Signed)
Some better May be from the Rx or natural history Is going to MD at [facility] tomorrow---referral was made at hospital

## 2014-08-15 NOTE — Telephone Encounter (Signed)
Forms faxed to [employer] at [phone].  Copy sent to scan, copy for my file, copy for charge and pt will pick up copy. Rollene Fare notified forms were faxed.

## 2014-08-15 NOTE — Telephone Encounter (Signed)
Form done and ready $20

## 2014-08-15 NOTE — Progress Notes (Signed)
Pre visit review using our clinic review tool, if applicable. No additional management support is needed unless otherwise documented below in the visit note. 

## 2014-08-15 NOTE — Progress Notes (Signed)
Subjective:    Patient ID: Alex Mueller, male    DOB: 1961-03-15, 54 y.o.   MRN: [id number]  HPI Reviewed hospital stay Had chest pain and was evaluated--- stress test negative  Still having the sciatica Stopped the meloxicam though Dilaudid prn On gabapentin Finishing medrol dose pack  Did take protonix briefly Stopped it now No recurrence of pain No heartburn No swallowing problems  Current Outpatient Prescriptions on File Prior to Visit  Medication Sig Dispense Refill  . CIALIS 20 MG tablet TAKE 1 TABLET (20 MG TOTAL) BY MOUTH DAILY AS NEEDED FOR ERECTILE DYSFUNCTION. 3 tablet 0  . diazepam (VALIUM) 5 MG tablet Take 1 tablet (5 mg total) by mouth every 6 (six) hours as needed for muscle spasms (spasms). 30 tablet 0  . EPINEPHrine (EPIPEN) 0.3 mg/0.3 mL DEVI Inject 0.3 mLs (0.3 mg total) into the muscle once. 1 Device 1  . gabapentin (NEURONTIN) 300 MG capsule 1 tablet nightly for 2 nights, then 1 tablet twice daily for 2 days, then 1 tablet 3 times daily for nerve pain 90 capsule 0  . HYDROmorphone (DILAUDID) 2 MG tablet Take 1-2 tablets (2-4 mg total) by mouth every 6 (six) hours as needed for severe pain. 30 tablet 0  . losartan-hydrochlorothiazide (HYZAAR) 100-25 MG per tablet TAKE 1 TABLET BY MOUTH DAILY. 90 tablet 0  . methylPREDNISolone (MEDROL DOSEPAK) 4 MG tablet follow package directions 21 tablet 0  . pantoprazole (PROTONIX) 40 MG tablet Take 1 tablet (40 mg total) by mouth daily. For 2 weeks 14 tablet 0  . promethazine (PHENERGAN) 12.5 MG tablet Take 1 tablet (12.5 mg total) by mouth every 6 (six) hours as needed for nausea or vomiting. 10 tablet 0   No current facility-administered medications on file prior to visit.    Allergies  Allergen Reactions  . Bee Venom Shortness Of Breath  . Lisinopril     REACTION: cough  . Oxycodone Nausea And Vomiting    Past Medical History  Diagnosis Date  . Hypertension   . Hyperlipidemia   . ED (erectile dysfunction)       Past Surgical History  Procedure Laterality Date  . Knee arthroscopy      right knee 1988, left knee 1998  . Foot surgery  07/05/2014    Family History  Problem Relation Age of Onset  . Healthy Mother   . Coronary artery disease Father   . Cancer Father     bladder and colon cancer  . Diabetes Father   . Coronary artery disease Paternal Uncle   . Drug abuse Maternal Grandmother   . Hypertension Neg Hx     History   Social History  . Marital Status: Married    Spouse Name: N/A    Number of Children: 2  . Years of Education: N/A   Occupational History  . [place] History Main Topics  . Smoking status: Never Smoker   . Smokeless tobacco: Never Used  . Alcohol Use: No  . Drug Use: No  . Sexual Activity: Yes   Other Topics Concern  . Not on file   Social History Narrative   Review of Systems Sleeping okay --but has to stay in recliner Appetite is okay No swallowing problems    Objective:   Physical Exam  Constitutional: He appears well-developed and well-nourished. No distress.  Neck: Normal range of motion. Neck supple. No thyromegaly present.  Cardiovascular: Normal rate, regular rhythm, normal heart  sounds and intact distal pulses.  Exam reveals no gallop.   No murmur heard. Pulmonary/Chest: Effort normal and breath sounds normal. No respiratory distress. He has no wheezes. He has no rales. He exhibits no tenderness.  Abdominal: Soft. There is no tenderness.  Musculoskeletal: He exhibits no edema or tenderness.  SLR still positive bilaterally but better  Lymphadenopathy:    He has no cervical adenopathy.  Psychiatric: He has a normal mood and affect. His behavior is normal.          Assessment & Plan:

## 2014-08-15 NOTE — Telephone Encounter (Signed)
Pt wife Rollene Fare works for [employer] and is needing FMLA for the time out of work all last week (08/08/14-08/12/14) for doctor visit, hospital admission, and recovery. Pt is following up today 08/15/14. Please review, complete and sign. Please return to [place] upon completion.  Forms in your IN box.

## 2014-08-23 ENCOUNTER — Ambulatory Visit: Payer: Self-pay | Admitting: Internal Medicine

## 2014-08-24 ENCOUNTER — Other Ambulatory Visit: Payer: Self-pay | Admitting: Internal Medicine

## 2014-08-24 NOTE — Telephone Encounter (Signed)
Last filled 08/08/14 #30--please advise

## 2014-08-25 NOTE — Telephone Encounter (Signed)
Rx called in to pharmacy. 

## 2014-08-25 NOTE — Telephone Encounter (Signed)
Approved: 30 x 0 

## 2014-08-31 ENCOUNTER — Other Ambulatory Visit: Payer: Self-pay | Admitting: Internal Medicine

## 2014-09-06 ENCOUNTER — Other Ambulatory Visit: Payer: Self-pay | Admitting: Internal Medicine

## 2014-09-06 NOTE — Telephone Encounter (Signed)
Received refill request electronically from pharmacy. Last refill 08/25/14 #30, last office visit 08/15/14. Is it okay to refill medication?

## 2014-09-06 NOTE — Telephone Encounter (Signed)
Pt left v/m wanting to ck on status of refill for diazepam for back pain; pt has appt with Percell Rewis and Noemi Chapel for epidural on 09/09/14.Please advise.

## 2014-09-07 NOTE — Telephone Encounter (Signed)
Valium called into [facility] per Dr Silvio Pate approval.

## 2014-09-07 NOTE — Telephone Encounter (Signed)
Approved: 30 x 0 

## 2014-09-26 ENCOUNTER — Encounter: Payer: Self-pay | Admitting: Internal Medicine

## 2014-09-26 ENCOUNTER — Ambulatory Visit (INDEPENDENT_AMBULATORY_CARE_PROVIDER_SITE_OTHER): Payer: BC Managed Care – PPO | Admitting: Internal Medicine

## 2014-09-26 VITALS — BP 140/78 | HR 75 | Temp 98.5°F | Wt 230.0 lb

## 2014-09-26 DIAGNOSIS — G479 Sleep disorder, unspecified: Secondary | ICD-10-CM

## 2014-09-26 MED ORDER — TRAZODONE HCL 50 MG PO TABS: 25.0000 mg | ORAL_TABLET | Freq: Every evening | ORAL | Status: DC | PRN

## 2014-09-26 NOTE — Progress Notes (Signed)
Subjective:    Patient ID: Alex Mueller, male    DOB: [birth year], 54 y.o.   MRN: [id number]  HPI  Pt presents to the clinic today with c/o difficulty sleeping. He reports this started 1-2 weeks ago. He reports he has trouble initiating sleep and staying asleep. He did have back surgery 2 weeks ago but reports he has never had trouble sleeping before. He was feeling anxious prior to his surgery. He reports he does not notice any anxiety during the day but at night he feels like he just can't "shut it off". He reports the pain in his back is not keeping him awake. He has tried some Benadryl but it did not help that much. He normally sleeps 7 hours at night. He has cut back on caffeine. He is also sleeping in a dark, quite room.  Review of Systems      Past Medical History  Diagnosis Date  . Hypertension   . Hyperlipidemia   . ED (erectile dysfunction)     Current Outpatient Prescriptions  Medication Sig Dispense Refill  . CIALIS 20 MG tablet TAKE 1 TABLET (20 MG TOTAL) BY MOUTH DAILY AS NEEDED FOR ERECTILE DYSFUNCTION. 3 tablet 0  . diazepam (VALIUM) 5 MG tablet TAKE 1 TABLET BY MOUTH EVERY 6 HOURS AS NEEDED 30 tablet 0  . EPINEPHrine (EPIPEN) 0.3 mg/0.3 mL DEVI Inject 0.3 mLs (0.3 mg total) into the muscle once. 1 Device 1  . gabapentin (NEURONTIN) 300 MG capsule 1 tablet nightly for 2 nights, then 1 tablet twice daily for 2 days, then 1 tablet 3 times daily for nerve pain 90 capsule 0  . HYDROmorphone (DILAUDID) 2 MG tablet Take 1-2 tablets (2-4 mg total) by mouth every 6 (six) hours as needed for severe pain. 30 tablet 0  . losartan-hydrochlorothiazide (HYZAAR) 100-25 MG per tablet TAKE 1 TABLET BY MOUTH EVERY DAY 90 tablet 1  . meloxicam (MOBIC) 15 MG tablet   1  . methylPREDNISolone (MEDROL DOSEPAK) 4 MG tablet follow package directions 21 tablet 0  . pantoprazole (PROTONIX) 40 MG tablet   0  . promethazine (PHENERGAN) 12.5 MG tablet Take 1 tablet (12.5 mg total) by mouth every 6  (six) hours as needed for nausea or vomiting. 10 tablet 0   No current facility-administered medications for this visit.    Allergies  Allergen Reactions  . Bee Venom Shortness Of Breath  . Lisinopril     REACTION: cough  . Oxycodone Nausea And Vomiting    Family History  Problem Relation Age of Onset  . Healthy Mother   . Coronary artery disease Father   . Cancer Father     bladder and colon cancer  . Diabetes Father   . Coronary artery disease Paternal Uncle   . Drug abuse Maternal Grandmother   . Hypertension Neg Hx     History   Social History  . Marital Status: Married    Spouse Name: N/A  . Number of Children: 2  . Years of Education: N/A   Occupational History  . [place] History Main Topics  . Smoking status: Never Smoker   . Smokeless tobacco: Never Used  . Alcohol Use: No  . Drug Use: No  . Sexual Activity: Yes   Other Topics Concern  . Not on file   Social History Narrative     Constitutional: Denies fever, malaise, fatigue, headache or abrupt weight changes.  Respiratory: Denies difficulty breathing, shortness  of breath, cough or sputum production.   Cardiovascular: Denies chest pain, chest tightness, palpitations or swelling in the hands or feet.  Neurological: Pt reports sleep disturbance. Denies dizziness, difficulty with memory, difficulty with speech or problems with balance and coordination.   No other specific complaints in a complete review of systems (except as listed in HPI above).  Objective:   Physical Exam   BP 140/78 mmHg  Pulse 75  Temp(Src) 98.5 F (36.9 C) (Oral)  Wt 230 lb (104.327 kg)  SpO2 98% Wt Readings from Last 3 Encounters:  09/26/14 230 lb (104.327 kg)  08/15/14 247 lb (112.038 kg)  08/10/14 241 lb 13.5 oz (109.7 kg)    General: Appears his stated age, well developed, well nourished in NAD. Cardiovascular: Normal rate and rhythm. S1,S2 noted.  No murmur, rubs or  gallops noted. Pulmonary/Chest: Normal effort and positive vesicular breath sounds. No respiratory distress. No wheezes, rales or ronchi noted.  Neurological: Alert and oriented.  Psychiatric: Mood and affect normal. Behavior is normal. Judgment and thought content normal.    BMET    Component Value Date/Time   NA 138 08/09/2014 1150   K 3.8 08/09/2014 1150   CL 103 08/09/2014 1150   CO2 29 08/09/2014 1150   GLUCOSE 81 08/09/2014 1150   BUN 12 08/09/2014 1150   CREATININE 0.98 08/09/2014 1450   CALCIUM 9.3 08/09/2014 1150   GFRNONAA >90 08/09/2014 1450   GFRAA >90 08/09/2014 1450    Lipid Panel     Component Value Date/Time   CHOL 186 03/22/2014 1017   TRIG 119.0 03/22/2014 1017   HDL 40.60 03/22/2014 1017   CHOLHDL 5 03/22/2014 1017   VLDL 23.8 03/22/2014 1017   LDLCALC 122* 03/22/2014 1017    CBC    Component Value Date/Time   WBC 11.7* 08/09/2014 1150   RBC 5.27 08/09/2014 1150   HGB 15.2 08/09/2014 1150   HCT 45.5 08/09/2014 1150   PLT 205 08/09/2014 1150   MCV 86.3 08/09/2014 1150   MCH 28.8 08/09/2014 1150   MCHC 33.4 08/09/2014 1150   RDW 13.6 08/09/2014 1150   LYMPHSABS 1.6 08/09/2014 1150   MONOABS 0.8 08/09/2014 1150   EOSABS 0.0 08/09/2014 1150   BASOSABS 0.0 08/09/2014 1150    Hgb A1C No results found for: HGBA1C      Assessment & Plan:   Sleep Disturbance:  He is not interested in try Melatonin- he does not think it will be effective Will trial Trazadone 25 mg QHS, if does not seem effective, increase to 50 mg QHS.  Advised him to take it only when needed, not every night if he can help it.  If no improvement, follow up with Dr. Silvio Pate

## 2014-09-26 NOTE — Patient Instructions (Signed)
Insomnia Insomnia is frequent trouble falling and/or staying asleep. Insomnia can be a long term problem or a short term problem. Both are common. Insomnia can be a short term problem when the wakefulness is related to a certain stress or worry. Long term insomnia is often related to ongoing stress during waking hours and/or poor sleeping habits. Overtime, sleep deprivation itself can make the problem worse. Every little thing feels more severe because you are overtired and your ability to cope is decreased. CAUSES   Stress, anxiety, and depression.  Poor sleeping habits.  Distractions such as TV in the bedroom.  Naps close to bedtime.  Engaging in emotionally charged conversations before bed.  Technical reading before sleep.  Alcohol and other sedatives. They may make the problem worse. They can hurt normal sleep patterns and normal dream activity.  Stimulants such as caffeine for several hours prior to bedtime.  Pain syndromes and shortness of breath can cause insomnia.  Exercise late at night.  Changing time zones may cause sleeping problems (jet lag). It is sometimes helpful to have someone observe your sleeping patterns. They should look for periods of not breathing during the night (sleep apnea). They should also look to see how long those periods last. If you live alone or observers are uncertain, you can also be observed at a sleep clinic where your sleep patterns will be professionally monitored. Sleep apnea requires a checkup and treatment. Give your caregivers your medical history. Give your caregivers observations your family has made about your sleep.  SYMPTOMS   Not feeling rested in the morning.  Anxiety and restlessness at bedtime.  Difficulty falling and staying asleep. TREATMENT   Your caregiver may prescribe treatment for an underlying medical disorders. Your caregiver can give advice or help if you are using alcohol or other drugs for self-medication. Treatment  of underlying problems will usually eliminate insomnia problems.  Medications can be prescribed for short time use. They are generally not recommended for lengthy use.  Over-the-counter sleep medicines are not recommended for lengthy use. They can be habit forming.  You can promote easier sleeping by making lifestyle changes such as:  Using relaxation techniques that help with breathing and reduce muscle tension.  Exercising earlier in the day.  Changing your diet and the time of your last meal. No night time snacks.  Establish a regular time to go to bed.  Counseling can help with stressful problems and worry.  Soothing music and white noise may be helpful if there are background noises you cannot remove.  Stop tedious detailed work at least one hour before bedtime. HOME CARE INSTRUCTIONS   Keep a diary. Inform your caregiver about your progress. This includes any medication side effects. See your caregiver regularly. Take note of:  Times when you are asleep.  Times when you are awake during the night.  The quality of your sleep.  How you feel the next day. This information will help your caregiver care for you.  Get out of bed if you are still awake after 15 minutes. Read or do some quiet activity. Keep the lights down. Wait until you feel sleepy and go back to bed.  Keep regular sleeping and waking hours. Avoid naps.  Exercise regularly.  Avoid distractions at bedtime. Distractions include watching television or engaging in any intense or detailed activity like attempting to balance the household checkbook.  Develop a bedtime ritual. Keep a familiar routine of bathing, brushing your teeth, climbing into bed at the same   time each night, listening to soothing music. Routines increase the success of falling to sleep faster.  Use relaxation techniques. This can be using breathing and muscle tension release routines. It can also include visualizing peaceful scenes. You can  also help control troubling or intruding thoughts by keeping your mind occupied with boring or repetitive thoughts like the old concept of counting sheep. You can make it more creative like imagining planting one beautiful flower after another in your backyard garden.  During your day, work to eliminate stress. When this is not possible use some of the previous suggestions to help reduce the anxiety that accompanies stressful situations. MAKE SURE YOU:   Understand these instructions.  Will watch your condition.  Will get help right away if you are not doing well or get worse. Document Released: 06/28/2000 Document Revised: 09/23/2011 Document Reviewed: 07/29/2007 ExitCare Patient Information 2015 [employer]. This information is not intended to replace advice given to you by your health care provider. Make sure you discuss any questions you have with your health care provider.  

## 2014-10-05 ENCOUNTER — Encounter: Payer: Self-pay | Admitting: Internal Medicine

## 2014-10-14 HISTORY — PX: LUMBAR DISC SURGERY: SHX700

## 2014-10-17 ENCOUNTER — Other Ambulatory Visit: Payer: Self-pay | Admitting: Internal Medicine

## 2014-11-12 ENCOUNTER — Other Ambulatory Visit: Payer: Self-pay | Admitting: Internal Medicine

## 2014-12-09 ENCOUNTER — Other Ambulatory Visit: Payer: Self-pay | Admitting: Internal Medicine

## 2014-12-09 NOTE — Telephone Encounter (Signed)
Last filled #45 11/14/14

## 2014-12-12 NOTE — Telephone Encounter (Signed)
Approved: okay to refill for a year 

## 2014-12-13 NOTE — Telephone Encounter (Signed)
rx sent to pharmacy by e-script  

## 2015-01-31 ENCOUNTER — Encounter: Payer: Self-pay | Admitting: Internal Medicine

## 2015-03-01 ENCOUNTER — Other Ambulatory Visit: Payer: Self-pay | Admitting: Internal Medicine

## 2015-03-02 ENCOUNTER — Encounter: Payer: Self-pay | Admitting: Internal Medicine

## 2015-03-24 ENCOUNTER — Encounter: Payer: BC Managed Care – PPO | Admitting: Internal Medicine

## 2015-03-27 ENCOUNTER — Ambulatory Visit (AMBULATORY_SURGERY_CENTER): Payer: Self-pay

## 2015-03-27 VITALS — Ht 77.0 in | Wt 247.0 lb

## 2015-03-27 DIAGNOSIS — Z860100 Personal history of colon polyps, unspecified: Secondary | ICD-10-CM

## 2015-03-27 NOTE — Progress Notes (Signed)
No allergies to eggs or soy No past problems with anesthesia except PONV with general No home oxygen No diet/weight loss meds  Refused emmi

## 2015-04-05 ENCOUNTER — Encounter: Payer: Self-pay | Admitting: Internal Medicine

## 2015-04-05 ENCOUNTER — Ambulatory Visit (AMBULATORY_SURGERY_CENTER): Payer: BC Managed Care – PPO | Admitting: Internal Medicine

## 2015-04-05 VITALS — BP 133/70 | HR 63 | Temp 97.4°F | Resp 18 | Ht 77.0 in | Wt 247.0 lb

## 2015-04-05 DIAGNOSIS — D125 Benign neoplasm of sigmoid colon: Secondary | ICD-10-CM

## 2015-04-05 DIAGNOSIS — Z86010 Personal history of colonic polyps: Secondary | ICD-10-CM

## 2015-04-05 DIAGNOSIS — Z800 Family history of malignant neoplasm of digestive organs: Secondary | ICD-10-CM | POA: Insufficient documentation

## 2015-04-05 MED ORDER — SODIUM CHLORIDE 0.9 % IV SOLN: 500.0000 mL | INTRAVENOUS | Status: DC

## 2015-04-05 NOTE — Progress Notes (Signed)
Patient awakening,vss,report to rn 

## 2015-04-05 NOTE — Op Note (Signed)
[facility]  [address] [place], [id number]   COLONOSCOPY PROCEDURE REPORT  PATIENT: Alex Mueller, Alex Mueller  MR#: [id number] BIRTHDATE: 10-01-1960 , 58  yrs. old GENDER: male ENDOSCOPIST: Gatha Mayer, MD, [facility] PROCEDURE DATE:  04/05/2015 PROCEDURE:   Colonoscopy, surveillance and Colonoscopy with snare polypectomy First Screening Colonoscopy - Avg.  risk and is 50 yrs.  old or older - No.  Prior Negative Screening - Now for repeat screening. N/A  History of Adenoma - Now for follow-up colonoscopy & has been > or = to 3 yrs.  Yes hx of adenoma.  Has been 3 or more years since last colonoscopy.  Polyps removed today? Yes ASA CLASS:   Class II INDICATIONS:Surveillance due to prior colonic neoplasia and PH Colon Adenoma. MEDICATIONS: Propofol 400 mg IV and Monitored anesthesia care  DESCRIPTION OF PROCEDURE:   After the risks benefits and alternatives of the procedure were thoroughly explained, informed consent was obtained.  The digital rectal exam revealed no abnormalities of the rectum, revealed no prostatic nodules, and revealed the prostate was not enlarged.   The LB [id number] endoscope was introduced through the anus and advanced to the cecum, which was identified by both the appendix and ileocecal valve. No adverse events experienced.   The quality of the prep was excellent.  The instrument was then slowly withdrawn as the colon was fully examined. Estimated blood loss is zero unless otherwise noted in this procedure report.      COLON FINDINGS: A polypoid shaped sessile polyp measuring 4 mm in size was found in the sigmoid colon.  A polypectomy was performed with a cold snare.  The resection was complete, the polyp tissue was completely retrieved and sent to histology.   The examination was otherwise normal.  Retroflexed views revealed no abnormalities. The time to cecum = 4.0 Withdrawal time = 13.1   The scope was withdrawn and the procedure  completed. COMPLICATIONS: There were no immediate complications.  ENDOSCOPIC IMPRESSION: 1.   Sessile polyp was found in the sigmoid colon; polypectomy was performed with a cold snare 2.   The examination was otherwise normal - excellent prep - hx adenoma 2006, Father had colon cancer about age 52  RECOMMENDATIONS: Timing of repeat colonoscopy will be determined by pathology findings.  eSigned:  Gatha Mayer, MD, [facility] 04/05/2015 3:14 PM   cc: The Patient

## 2015-04-05 NOTE — Patient Instructions (Addendum)
I found and removed one small polyp that looks benign.  I will let you know pathology results and when to have another routine colonoscopy by mail. Likely 5 years.  I appreciate the opportunity to care for you. Gatha Mayer, MD, FACG     YOU HAD AN ENDOSCOPIC PROCEDURE TODAY AT [facility]:   Refer to the procedure report that was given to you for any specific questions about what was found during the examination.  If the procedure report does not answer your questions, please call your gastroenterologist to clarify.  If you requested that your care partner not be given the details of your procedure findings, then the procedure report has been included in a sealed envelope for you to review at your convenience later.  YOU SHOULD EXPECT: Some feelings of bloating in the abdomen. Passage of more gas than usual.  Walking can help get rid of the air that was put into your GI tract during the procedure and reduce the bloating. If you had a lower endoscopy (such as a colonoscopy or flexible sigmoidoscopy) you may notice spotting of blood in your stool or on the toilet paper. If you underwent a bowel prep for your procedure, you may not have a normal bowel movement for a few days.  Please Note:  You might notice some irritation and congestion in your nose or some drainage.  This is from the oxygen used during your procedure.  There is no need for concern and it should clear up in a day or so.  SYMPTOMS TO REPORT IMMEDIATELY:   Following lower endoscopy (colonoscopy or flexible sigmoidoscopy):  Excessive amounts of blood in the stool  Significant tenderness or worsening of abdominal pains  Swelling of the abdomen that is new, acute  Fever of 100F or higher   For urgent or emergent issues, a gastroenterologist can be reached at any hour by calling [phone].   DIET: Your first meal following the procedure should be a small meal and then it is ok to progress to  your normal diet. Heavy or fried foods are harder to digest and may make you feel nauseous or bloated.  Likewise, meals heavy in dairy and vegetables can increase bloating.  Drink plenty of fluids but you should avoid alcoholic beverages for 24 hours.  ACTIVITY:  You should plan to take it easy for the rest of today and you should NOT DRIVE or use heavy machinery until tomorrow (because of the sedation medicines used during the test).    FOLLOW UP: Our staff will call the number listed on your records the next business day following your procedure to check on you and address any questions or concerns that you may have regarding the information given to you following your procedure. If we do not reach you, we will leave a message.  However, if you are feeling well and you are not experiencing any problems, there is no need to return our call.  We will assume that you have returned to your regular daily activities without incident.  If any biopsies were taken you will be contacted by phone or by letter within the next 1-3 weeks.  Please call us at [phone] if you have not heard about the biopsies in 3 weeks.    SIGNATURES/CONFIDENTIALITY: You and/or your care partner have signed paperwork which will be entered into your electronic medical record.  These signatures attest to the fact that that the information above on your After  Visit Summary has been reviewed and is understood.  Full responsibility of the confidentiality of this discharge information lies with you and/or your care-partner.   Resume medications. Information given on polyps.

## 2015-04-05 NOTE — Progress Notes (Signed)
Called to room to assist during endoscopic procedure.  Patient ID and intended procedure confirmed with present staff. Received instructions for my participation in the procedure from the performing physician.  

## 2015-04-06 ENCOUNTER — Telehealth: Payer: Self-pay

## 2015-04-06 NOTE — Telephone Encounter (Signed)
  Follow up Call-  Call back number 04/05/2015  Post procedure Call Back phone  # [phone]  Permission to leave phone message Yes     Patient questions:  Do you have a fever, pain , or abdominal swelling? No. Pain Score  0 *  Have you tolerated food without any problems? Yes.    Have you been able to return to your normal activities? Yes.    Do you have any questions about your discharge instructions: Diet   No. Medications  No. Follow up visit  No.  Do you have questions or concerns about your Care? No.  Actions: * If pain score is 4 or above: No action needed, pain <4.

## 2015-04-12 ENCOUNTER — Encounter: Payer: Self-pay | Admitting: Internal Medicine

## 2015-04-12 NOTE — Progress Notes (Signed)
Quick Note:  4 mm adenoma Repeat colonoscopy 2021 ______

## 2015-06-20 IMAGING — CR DG HIP (WITH OR WITHOUT PELVIS) 2-3V*R*
3 series · 3 of 3 positions shown · non-contrast
Comparison: None.

CLINICAL DATA: Initial encounter for severe sciatica and hip pain.
Left-sided.

EXAM:
DG HIP W/ PELVIS 2-3V*R*

[view not recorded (1 of 3)]
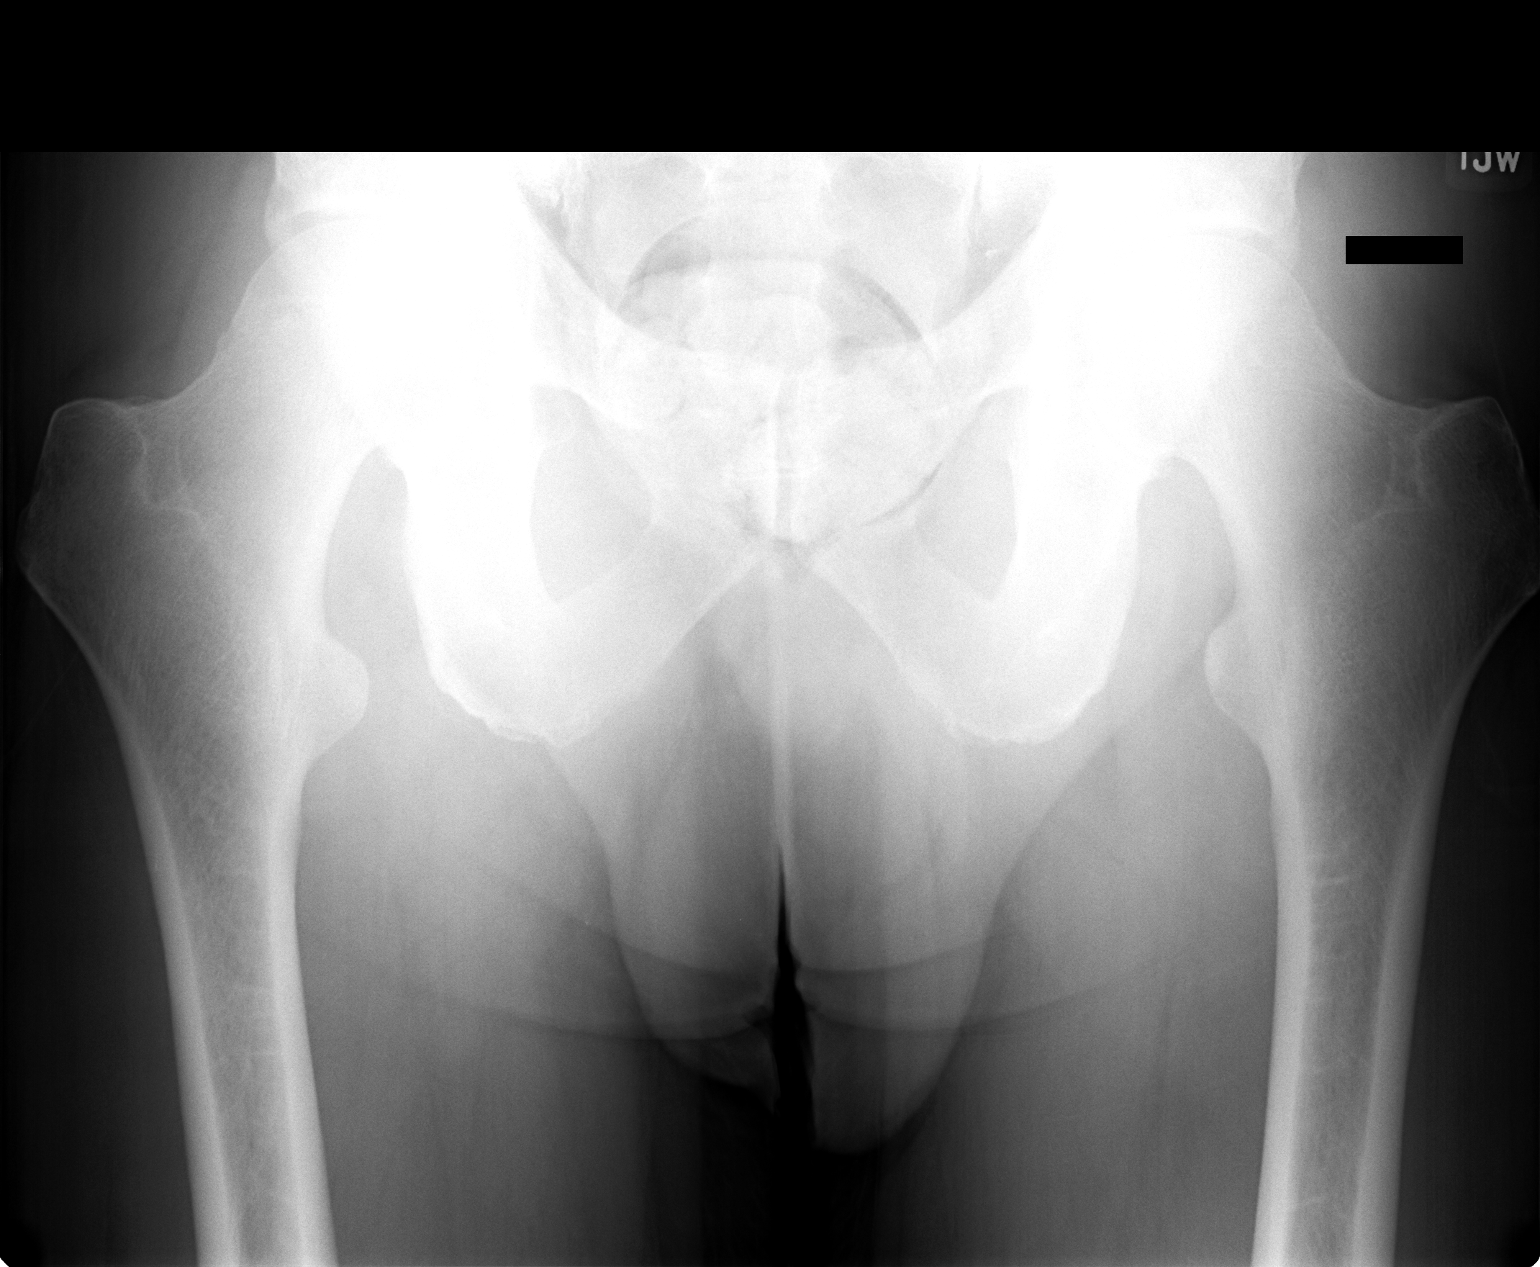

[view not recorded (2 of 3)]
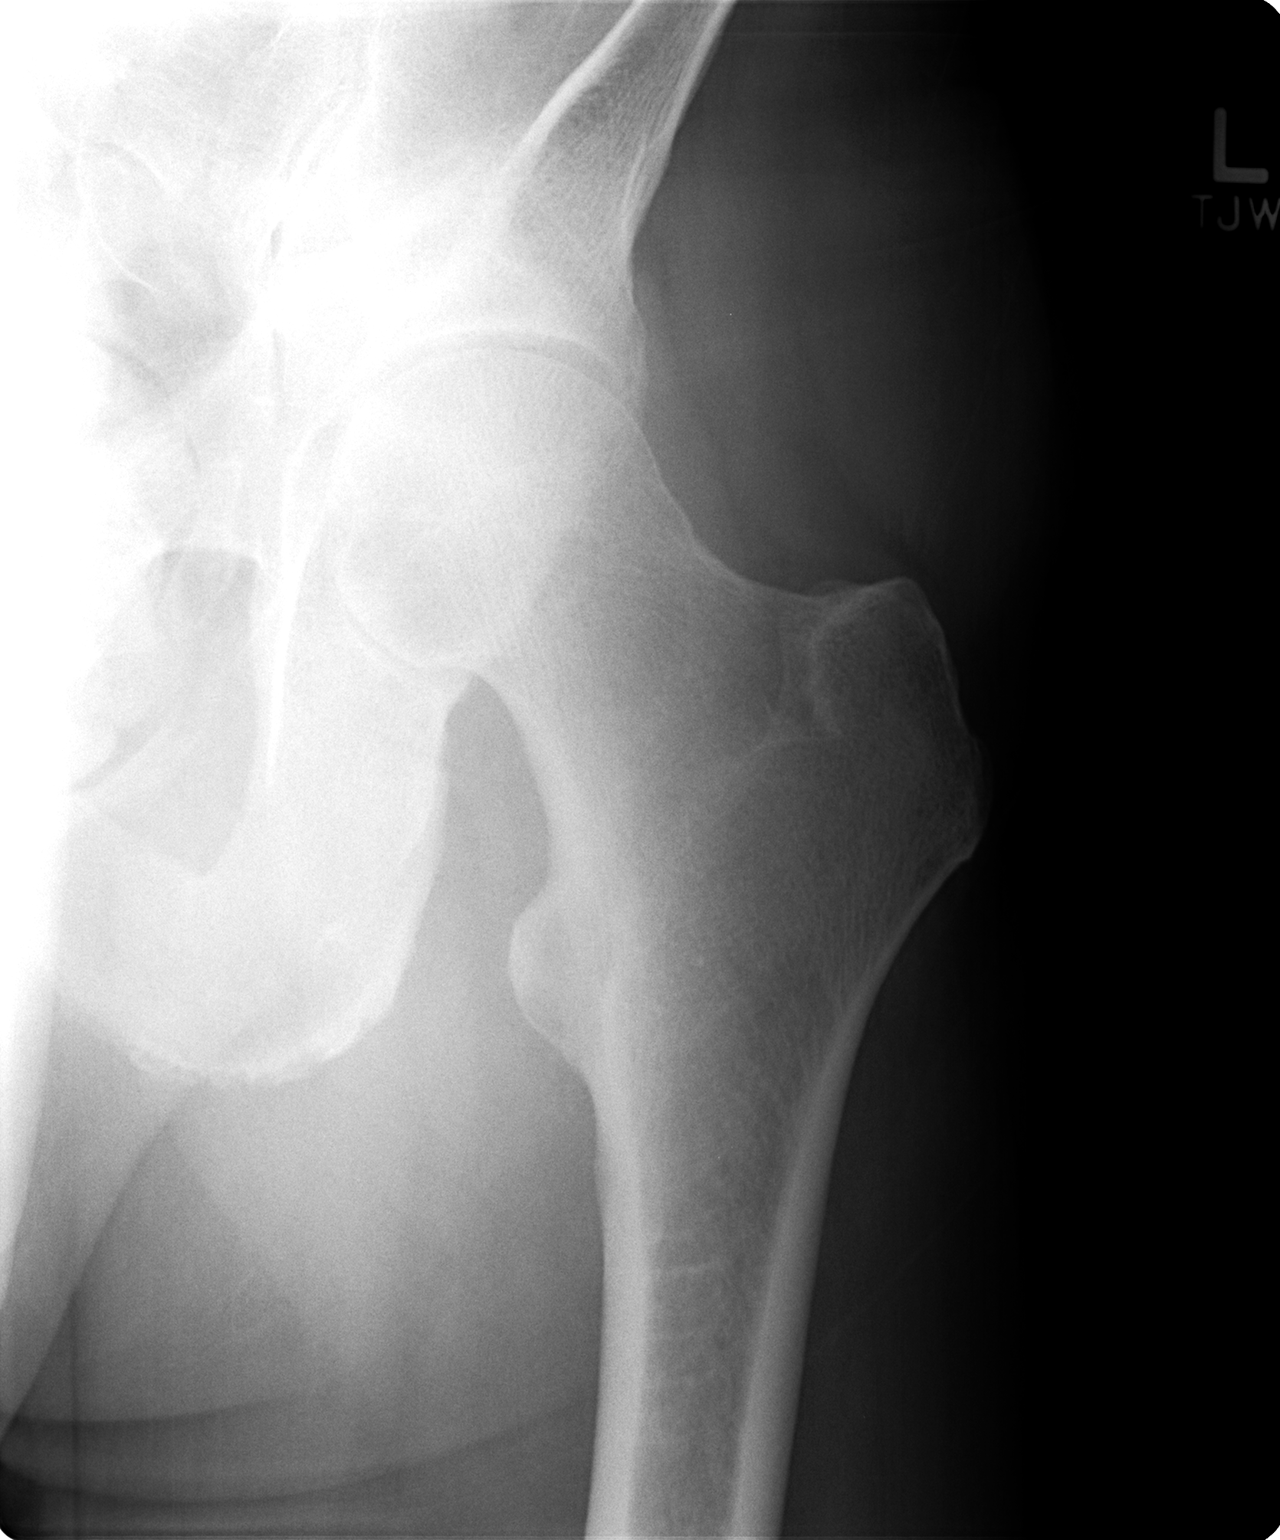

[view not recorded (3 of 3)]
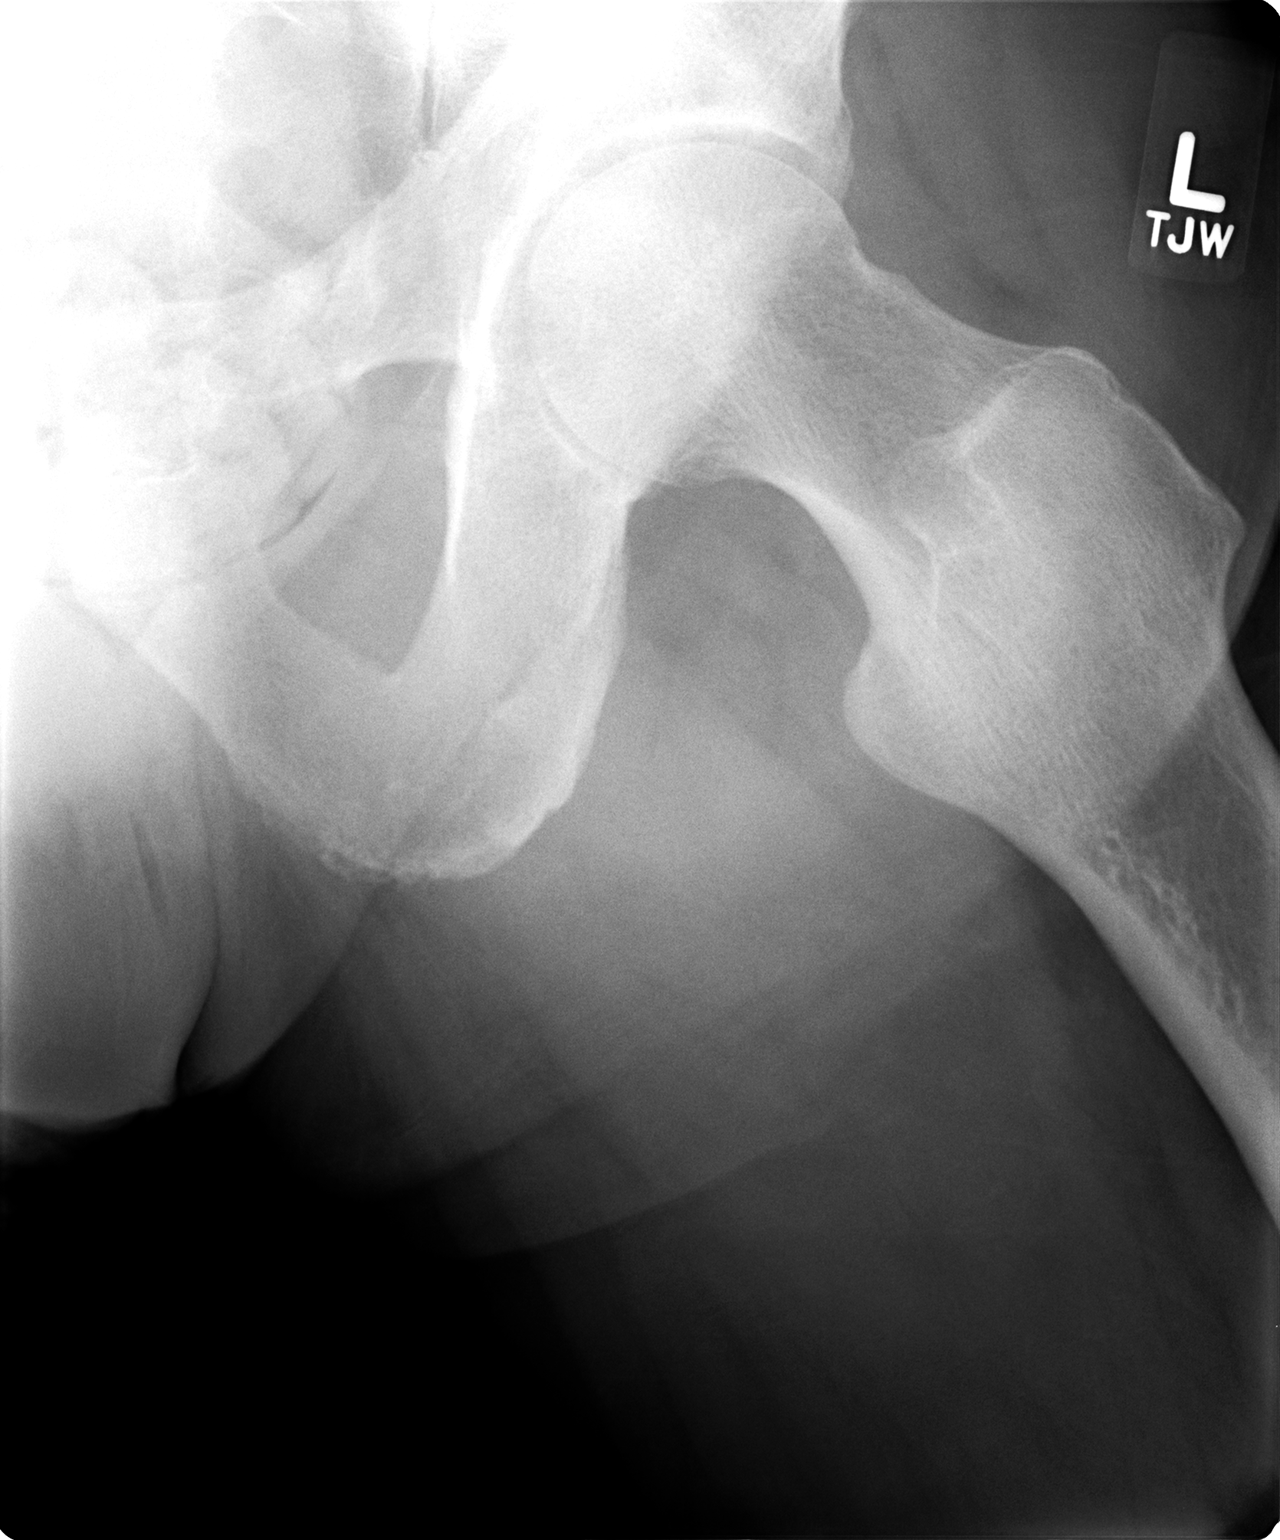

[3 of 3 positions shown; findings below may reference images not displayed]

FINDINGS: Femoral heads are located. Imaged sacroiliac joints are symmetric.
Minimal bilateral weight-bearing hip joint space narrowing. No acute
fracture. No focal osseous lesion.
IMPRESSION: Minimal bilateral hip osteoarthritis.

## 2015-07-05 ENCOUNTER — Other Ambulatory Visit: Payer: Self-pay | Admitting: Internal Medicine

## 2015-10-01 ENCOUNTER — Other Ambulatory Visit: Payer: Self-pay | Admitting: Internal Medicine

## 2015-10-30 ENCOUNTER — Other Ambulatory Visit: Payer: Self-pay | Admitting: Internal Medicine

## 2015-11-25 ENCOUNTER — Other Ambulatory Visit: Payer: Self-pay | Admitting: Internal Medicine

## 2015-11-27 NOTE — Telephone Encounter (Signed)
Spoke to patient. Made appt 12-05-15. Sent refill for 1 month

## 2015-11-27 NOTE — Telephone Encounter (Signed)
Okay to give 1 month and he needs appt before more

## 2015-11-27 NOTE — Telephone Encounter (Signed)
The patient was last seen by Webb Silversmith on 09-26-14 for sleep issues. Cancelled appt with Dr Silvio Pate 03-24-15. Last BMET was 08-09-14. Pt is asking for Losartan-HCTZ refill.

## 2015-12-05 ENCOUNTER — Ambulatory Visit (INDEPENDENT_AMBULATORY_CARE_PROVIDER_SITE_OTHER): Payer: BC Managed Care – PPO | Admitting: Internal Medicine

## 2015-12-05 ENCOUNTER — Encounter: Payer: Self-pay | Admitting: Internal Medicine

## 2015-12-05 VITALS — BP 138/86 | HR 67 | Temp 97.8°F | Wt 241.0 lb

## 2015-12-05 DIAGNOSIS — I10 Essential (primary) hypertension: Secondary | ICD-10-CM | POA: Diagnosis not present

## 2015-12-05 DIAGNOSIS — Z125 Encounter for screening for malignant neoplasm of prostate: Secondary | ICD-10-CM

## 2015-12-05 LAB — COMPREHENSIVE METABOLIC PANEL
ALT: 17 U/L (ref 0–53)
AST: 15 U/L (ref 0–37)
Albumin: 4.3 g/dL (ref 3.5–5.2)
Alkaline Phosphatase: 58 U/L (ref 39–117)
BILIRUBIN TOTAL: 0.7 mg/dL (ref 0.2–1.2)
BUN: 19 mg/dL (ref 6–23)
CALCIUM: 9.6 mg/dL (ref 8.4–10.5)
CHLORIDE: 103 meq/L (ref 96–112)
CO2: 30 meq/L (ref 19–32)
CREATININE: 0.91 mg/dL (ref 0.40–1.50)
GFR: 91.56 mL/min (ref >60.00)
Glucose, Bld: 94 mg/dL (ref 70–99)
Potassium: 4.6 mEq/L (ref 3.5–5.1)
SODIUM: 138 meq/L (ref 135–145)
Total Protein: 6.7 g/dL (ref 6.0–8.3)

## 2015-12-05 LAB — CBC WITH DIFFERENTIAL/PLATELET
BASOS ABS: 0.1 10*3/uL (ref 0.0–0.1)
BASOS PCT: 0.8 % (ref 0.0–3.0)
EOS ABS: 0.2 10*3/uL (ref 0.0–0.7)
Eosinophils Relative: 2.9 % (ref 0.0–5.0)
HCT: 45.9 % (ref 39.0–52.0)
Hemoglobin: 15.5 g/dL (ref 13.0–17.0)
LYMPHS ABS: 2.4 10*3/uL (ref 0.7–4.0)
LYMPHS PCT: 33.7 % (ref 12.0–46.0)
MCHC: 33.7 g/dL (ref 30.0–36.0)
MCV: 86.3 fl (ref 78.0–100.0)
MONO ABS: 0.5 10*3/uL (ref 0.1–1.0)
Monocytes Relative: 7.2 % (ref 3.0–12.0)
NEUTROS ABS: 3.9 10*3/uL (ref 1.4–7.7)
NEUTROS PCT: 55.4 % (ref 43.0–77.0)
PLATELETS: 278 10*3/uL (ref 150.0–400.0)
RBC: 5.32 Mil/uL (ref 4.22–5.81)
RDW: 13.7 % (ref 11.5–15.5)
WBC: 7.1 10*3/uL (ref 4.0–10.5)

## 2015-12-05 LAB — PSA: PSA: 1.21 ng/mL (ref 0.10–4.00)

## 2015-12-05 NOTE — Progress Notes (Signed)
   Subjective:    Patient ID: Alex Mueller, male    DOB: Apr 10, 1961, 55 y.o.   MRN: [id number]  HPI Here for follow up of hypertension  Was having back trouble last year Eventually had surgery on ruptured disc last April This is better now  Some issues sleeping after the surgery--after stopping the pain meds May have had some withdrawal This eventually resolved Took the trazodone briefly Now using melatonin 10mg  occasionally--this helps (now taking 5-10mg )  He checks his BP at home periodically Usually 130/70 at hom No headaches No chest pain or SOB No dizziness or syncope No edema  Current Outpatient Prescriptions on File Prior to Visit  Medication Sig Dispense Refill  . EPINEPHrine (EPIPEN) 0.3 mg/0.3 mL DEVI Inject 0.3 mLs (0.3 mg total) into the muscle once. 1 Device 1  . losartan-hydrochlorothiazide (HYZAAR) 100-25 MG tablet Take 1 by mouth daily 30 tablet 0   No current facility-administered medications on file prior to visit.    Allergies  Allergen Reactions  . Bee Venom Shortness Of Breath  . Lisinopril     REACTION: cough  . Oxycodone Nausea And Vomiting    Past Medical History  Diagnosis Date  . Hypertension   . Hyperlipidemia   . ED (erectile dysfunction)   . Hx of adenomatous colonic polyps 12/03/2004    Past Surgical History  Procedure Laterality Date  . Knee arthroscopy      right knee 1988, left knee 1998  . Foot surgery  07/05/2014  . Lumbar disc surgery  10/2014  . Colonoscopy      Family History  Problem Relation Age of Onset  . Healthy Mother   . Coronary artery disease Father   . Cancer Father     bladder and colon cancer  . Diabetes Father   . Coronary artery disease Paternal Uncle   . Drug abuse Maternal Grandmother   . Hypertension Neg Hx     Social History   Social History  . Marital Status: Married    Spouse Name: N/A  . Number of Children: 2  . Years of Education: N/A   Occupational History  . [employer] History Main Topics  . Smoking status: Never Smoker   . Smokeless tobacco: Never Used  . Alcohol Use: No  . Drug Use: No  . Sexual Activity: Yes   Other Topics Concern  . Not on file   Social History Narrative   Review of Systems Appetite is good Weight down a little from last year--trying to do well with lifestyle Bowels are okay    Objective:   Physical Exam  Constitutional: He appears well-developed and well-nourished. No distress.  Neck: Normal range of motion. Neck supple. No thyromegaly present.  Cardiovascular: Normal rate, regular rhythm, normal heart sounds and intact distal pulses.  Exam reveals no gallop.   No murmur heard. Pulmonary/Chest: Effort normal and breath sounds normal. No respiratory distress. He has no wheezes. He has no rales.  Musculoskeletal: He exhibits no edema or tenderness.  Lymphadenopathy:    He has no cervical adenopathy.  Psychiatric: He has a normal mood and affect. His behavior is normal.          Assessment & Plan:

## 2015-12-05 NOTE — Assessment & Plan Note (Signed)
BP Readings from Last 3 Encounters:  12/05/15 138/86  04/05/15 133/70  09/26/14 140/78   Reasonable control Discussed lifestyle Continue current medication

## 2015-12-05 NOTE — Progress Notes (Signed)
Pre visit review using our clinic review tool, if applicable. No additional management support is needed unless otherwise documented below in the visit note. 

## 2016-01-10 ENCOUNTER — Other Ambulatory Visit: Payer: Self-pay | Admitting: Internal Medicine

## 2016-02-09 ENCOUNTER — Other Ambulatory Visit: Payer: Self-pay | Admitting: Internal Medicine

## 2016-02-09 NOTE — Telephone Encounter (Signed)
Medication no longer on med list. It was taken off the list in May 2016. Last OV 12-05-15 Next OV 12-10-16. This can wait until Dr Silvio Pate returns on Monday.

## 2016-02-10 NOTE — Telephone Encounter (Signed)
Approved: #5 x 11

## 2016-11-17 ENCOUNTER — Other Ambulatory Visit: Payer: Self-pay | Admitting: Internal Medicine

## 2016-12-10 ENCOUNTER — Ambulatory Visit (INDEPENDENT_AMBULATORY_CARE_PROVIDER_SITE_OTHER): Payer: BC Managed Care – PPO | Admitting: Internal Medicine

## 2016-12-10 ENCOUNTER — Encounter: Payer: BC Managed Care – PPO | Admitting: Internal Medicine

## 2016-12-10 ENCOUNTER — Encounter: Payer: Self-pay | Admitting: Internal Medicine

## 2016-12-10 VITALS — BP 124/80 | HR 63 | Temp 97.5°F | Ht 76.0 in | Wt 245.0 lb

## 2016-12-10 DIAGNOSIS — Z0000 Encounter for general adult medical examination without abnormal findings: Secondary | ICD-10-CM | POA: Diagnosis not present

## 2016-12-10 DIAGNOSIS — I83811 Varicose veins of right lower extremities with pain: Secondary | ICD-10-CM | POA: Diagnosis not present

## 2016-12-10 DIAGNOSIS — I10 Essential (primary) hypertension: Secondary | ICD-10-CM | POA: Diagnosis not present

## 2016-12-10 MED ORDER — SILDENAFIL CITRATE 20 MG PO TABS: 60.0000 mg | ORAL_TABLET | Freq: Every day | ORAL | 11 refills | Status: DC | PRN

## 2016-12-10 MED ORDER — LOSARTAN POTASSIUM-HCTZ 100-25 MG PO TABS: 1.0000 | ORAL_TABLET | Freq: Every day | ORAL | 3 refills | Status: DC

## 2016-12-10 MED ORDER — EPINEPHRINE 0.3 MG/0.3ML IJ SOAJ: 0.3000 mg | Freq: Once | INTRAMUSCULAR | 0 refills | Status: AC

## 2016-12-10 NOTE — Progress Notes (Signed)
Subjective:    Patient ID: Alex Mueller, male    DOB: [birth year], 56 y.o.   MRN: [id number]  HPI Here for physical  BP seems to be okay--checks it periodically Generally thinks he is doing well  Some swelling in right popliteal fossa Noticed it recently Some pain---after getting up after squatting No edema  Current Outpatient Prescriptions on File Prior to Visit  Medication Sig Dispense Refill  . CIALIS 20 MG tablet TAKE 1 TABLET (20 MG TOTAL) BY MOUTH DAILY AS NEEDED FOR ERECTILE DYSFUNCTION. 5 tablet 11  . losartan-hydrochlorothiazide (HYZAAR) 100-25 MG tablet Take 1 tablet by mouth daily. NEEDS OFFICE VISIT 30 tablet 0  . Melatonin 5 MG TABS Take 5-10 mg by mouth at bedtime as needed.     No current facility-administered medications on file prior to visit.     Allergies  Allergen Reactions  . Bee Venom Shortness Of Breath  . Lisinopril     REACTION: cough  . Oxycodone Nausea And Vomiting    Past Medical History:  Diagnosis Date  . ED (erectile dysfunction)   . Hx of adenomatous colonic polyps 12/03/2004  . Hyperlipidemia   . Hypertension     Past Surgical History:  Procedure Laterality Date  . COLONOSCOPY    . FOOT SURGERY  07/05/2014  . KNEE ARTHROSCOPY     right knee 1988, left knee 1998  . LUMBAR DISC SURGERY  10/2014    Family History  Problem Relation Age of Onset  . Atrial fibrillation Mother   . Coronary artery disease Father   . Cancer Father        bladder and colon cancer  . Diabetes Father   . Coronary artery disease Paternal Uncle   . Drug abuse Maternal Grandmother   . Hypertension Neg Hx     Social History   Social History  . Marital status: Married    Spouse name: N/A  . Number of children: 2  . Years of education: N/A   Occupational History  . [employer] History Main Topics  . Smoking status: Never Smoker  . Smokeless tobacco: Never Used  . Alcohol use No  . Drug use: No  .  Sexual activity: Yes   Other Topics Concern  . Not on file   Social History Narrative  . No narrative on file   Review of Systems  Constitutional: Negative for fatigue and unexpected weight change.       Wears seat belt  HENT: Negative for dental problem, hearing loss, tinnitus and trouble swallowing.        Keeps up with dentist  Eyes: Negative for visual disturbance.       No diplopia or unilateral vision loss  Respiratory: Negative for cough, chest tightness and shortness of breath.   Cardiovascular: Negative for chest pain, palpitations and leg swelling.  Gastrointestinal: Negative for abdominal pain, blood in stool and constipation.       No heart burn  Endocrine: Negative for polydipsia and polyuria.  Genitourinary: Negative for difficulty urinating and urgency.       Some ED---cialis too expensive  Musculoskeletal: Negative for arthralgias, back pain and joint swelling.       Stiff in AM--especially in back  Skin: Negative for rash.       No suspicious lesions  Allergic/Immunologic: Positive for environmental allergies. Negative for immunocompromised state.       Will use OTC prn  Neurological: Negative for dizziness, syncope,  light-headedness and headaches.  Hematological: Negative for adenopathy. Does not bruise/bleed easily.  Psychiatric/Behavioral: Negative for dysphoric mood and sleep disturbance. The patient is not nervous/anxious.        Sleeps better--not needing the melatonin lately       Objective:   Physical Exam  Constitutional: He is oriented to person, place, and time. He appears well-nourished. No distress.  HENT:  Head: Normocephalic and atraumatic.  Right Ear: External ear normal.  Left Ear: External ear normal.  Mouth/Throat: Oropharynx is clear and moist. No oropharyngeal exudate.  Eyes: Conjunctivae are normal. Pupils are equal, round, and reactive to light.  Neck: No thyromegaly present.  Cardiovascular: Normal rate, regular rhythm, normal  heart sounds and intact distal pulses.  Exam reveals no gallop.   No murmur heard. Pulmonary/Chest: Effort normal and breath sounds normal. No respiratory distress. He has no wheezes. He has no rales.  Abdominal: Soft. There is no tenderness.  Musculoskeletal: He exhibits no edema or tenderness.  Moderate varicosities without tenderness behind right knee  Lymphadenopathy:    He has no cervical adenopathy.  Neurological: He is alert and oriented to person, place, and time.  Skin: No rash noted. No erythema.  Psychiatric: He has a normal mood and affect. His behavior is normal.          Assessment & Plan:

## 2016-12-10 NOTE — Assessment & Plan Note (Signed)
Mild only Discussed compression with elastic sleeve when up on feet for prolonged times

## 2016-12-10 NOTE — Assessment & Plan Note (Signed)
Healthy Discussed fitness Defer PSA till at least next year Colon due 2021 Discussed shingrix

## 2016-12-10 NOTE — Assessment & Plan Note (Signed)
BP Readings from Last 3 Encounters:  12/10/16 124/80  12/05/15 138/86  04/05/15 133/70   Good control Due for labs

## 2016-12-11 LAB — CBC WITH DIFFERENTIAL/PLATELET
Basophils Absolute: 0.1 10*3/uL (ref 0.0–0.1)
Basophils Relative: 1 % (ref 0.0–3.0)
EOS ABS: 0.1 10*3/uL (ref 0.0–0.7)
Eosinophils Relative: 1.1 % (ref 0.0–5.0)
HEMATOCRIT: 46.1 % (ref 39.0–52.0)
Hemoglobin: 15.4 g/dL (ref 13.0–17.0)
LYMPHS PCT: 39.1 % (ref 12.0–46.0)
Lymphs Abs: 3.2 10*3/uL (ref 0.7–4.0)
MCHC: 33.4 g/dL (ref 30.0–36.0)
MCV: 87.8 fl (ref 78.0–100.0)
MONO ABS: 0.6 10*3/uL (ref 0.1–1.0)
Monocytes Relative: 7 % (ref 3.0–12.0)
Neutro Abs: 4.2 10*3/uL (ref 1.4–7.7)
Neutrophils Relative %: 51.8 % (ref 43.0–77.0)
Platelets: 285 10*3/uL (ref 150.0–400.0)
RBC: 5.25 Mil/uL (ref 4.22–5.81)
RDW: 14.2 % (ref 11.5–15.5)
WBC: 8.2 10*3/uL (ref 4.0–10.5)

## 2016-12-11 LAB — COMPREHENSIVE METABOLIC PANEL
ALBUMIN: 4.4 g/dL (ref 3.5–5.2)
ALK PHOS: 56 U/L (ref 39–117)
ALT: 19 U/L (ref 0–53)
AST: 19 U/L (ref 0–37)
BUN: 12 mg/dL (ref 6–23)
CALCIUM: 9.5 mg/dL (ref 8.4–10.5)
CO2: 31 mEq/L (ref 19–32)
CREATININE: 0.98 mg/dL (ref 0.40–1.50)
Chloride: 103 mEq/L (ref 96–112)
GFR: 83.75 mL/min (ref >60.00)
Glucose, Bld: 83 mg/dL (ref 70–99)
POTASSIUM: 4.1 meq/L (ref 3.5–5.1)
SODIUM: 138 meq/L (ref 135–145)
TOTAL PROTEIN: 7.3 g/dL (ref 6.0–8.3)
Total Bilirubin: 0.6 mg/dL (ref 0.2–1.2)

## 2017-04-30 ENCOUNTER — Ambulatory Visit (INDEPENDENT_AMBULATORY_CARE_PROVIDER_SITE_OTHER): Payer: BC Managed Care – PPO | Admitting: Family Medicine

## 2017-04-30 VITALS — BP 142/88 | HR 73 | Temp 98.0°F | Wt 251.0 lb

## 2017-04-30 DIAGNOSIS — R059 Cough, unspecified: Secondary | ICD-10-CM

## 2017-04-30 DIAGNOSIS — R05 Cough: Secondary | ICD-10-CM

## 2017-04-30 MED ORDER — BENZONATATE 100 MG PO CAPS: 100.0000 mg | ORAL_CAPSULE | Freq: Three times a day (TID) | ORAL | 0 refills | Status: DC | PRN

## 2017-04-30 NOTE — Patient Instructions (Signed)
Try to avoid/supress cough as much as possible- cough lozenges, and I have sent a prescription to your pharmacy Continue your daily antihistamine for 10-14 days If not better in 2 weeks, let me or Dr. Silvio Pate know   Cough, Adult Coughing is a reflex that clears your throat and your airways. Coughing helps to heal and protect your lungs. It is normal to cough occasionally, but a cough that happens with other symptoms or lasts a long time may be a sign of a condition that needs treatment. A cough may last only 2-3 weeks (acute), or it may last longer than 8 weeks (chronic). What are the causes? Coughing is commonly caused by:  Breathing in substances that irritate your lungs.  A viral or bacterial respiratory infection.  Allergies.  Asthma.  Postnasal drip.  Smoking.  Acid backing up from the stomach into the esophagus (gastroesophageal reflux).  Certain medicines.  Chronic lung problems, including COPD (or rarely, lung cancer).  Other medical conditions such as heart failure.  Follow these instructions at home: Pay attention to any changes in your symptoms. Take these actions to help with your discomfort:  Take medicines only as told by your health care provider. ? If you were prescribed an antibiotic medicine, take it as told by your health care provider. Do not stop taking the antibiotic even if you start to feel better. ? Talk with your health care provider before you take a cough suppressant medicine.  Drink enough fluid to keep your urine clear or pale yellow.  If the air is dry, use a cold steam vaporizer or humidifier in your bedroom or your home to help loosen secretions.  Avoid anything that causes you to cough at work or at home.  If your cough is worse at night, try sleeping in a semi-upright position.  Avoid cigarette smoke. If you smoke, quit smoking. If you need help quitting, ask your health care provider.  Avoid caffeine.  Avoid alcohol.  Rest as  needed.  Contact a health care provider if:  You have new symptoms.  You cough up pus.  Your cough does not get better after 2-3 weeks, or your cough gets worse.  You cannot control your cough with suppressant medicines and you are losing sleep.  You develop pain that is getting worse or pain that is not controlled with pain medicines.  You have a fever.  You have unexplained weight loss.  You have night sweats. Get help right away if:  You cough up blood.  You have difficulty breathing.  Your heartbeat is very fast. This information is not intended to replace advice given to you by your health care provider. Make sure you discuss any questions you have with your health care provider. Document Released: 12/28/2010 Document Revised: 12/07/2015 Document Reviewed: 09/07/2014 Elsevier Interactive Patient Education  2017 [employer].

## 2017-04-30 NOTE — Progress Notes (Signed)
   Subjective:    Patient ID: Alex Mueller, male    DOB: [birth year], 56 y.o.   MRN: [id number]  HPI This is a 56 yo male who presents today with cough x 1 month. Had a cold with clear, watery drainage. Cough comes and goes, sometimes with PND, tickle in throat. Not increased with lying down. Some increase with talking. No SOB. Does not always cough throughout the day, may go hours without coughing. Has been taking on OTC allergy medicine daily for 3-4 days, not sure if any improvement.  No history or asthma, no smoking history.   Past Medical History:  Diagnosis Date  . ED (erectile dysfunction)   . Hx of adenomatous colonic polyps 12/03/2004  . Hyperlipidemia   . Hypertension    Past Surgical History:  Procedure Laterality Date  . COLONOSCOPY    . FOOT SURGERY  07/05/2014  . KNEE ARTHROSCOPY     right knee 1988, left knee 1998  . LUMBAR DISC SURGERY  10/2014   Family History  Problem Relation Age of Onset  . Atrial fibrillation Mother   . Coronary artery disease Father   . Cancer Father        bladder and colon cancer  . Diabetes Father   . Coronary artery disease Paternal Uncle   . Drug abuse Maternal Grandmother   . Hypertension Neg Hx       Review of Systems     Objective:   Physical Exam Physical Exam  Constitutional: Oriented to person, place, and time. He appears well-developed and well-nourished.  HENT:  Head: Normocephalic and atraumatic.  Eyes: Conjunctivae are normal.  Neck: Normal range of motion. Neck supple.  Cardiovascular: Normal rate, regular rhythm and normal heart sounds.   Pulmonary/Chest: Effort normal and breath sounds normal.  Musculoskeletal: Normal range of motion.  Neurological: Alert and oriented to person, place, and time.  Skin: Skin is warm and dry.  Psychiatric: Normal mood and affect. Behavior is normal. Judgment and thought content normal.  Vitals reviewed.  BP (!) 142/88 (BP Location: Right Arm, Patient Position: Sitting, Cuff  Size: Normal)   Pulse 73   Temp 98 F (36.7 C) (Oral)   Wt 251 lb (113.9 kg)   SpO2 98%   BMI 30.55 kg/m  Wt Readings from Last 3 Encounters:  04/30/17 251 lb (113.9 kg)  12/10/16 245 lb (111.1 kg)  12/05/15 241 lb (109.3 kg)   BP Readings from Last 3 Encounters:  04/30/17 (!) 142/88  12/10/16 124/80  12/05/15 138/86       Assessment & Plan:  1. Cough - Provided written and verbal information regarding diagnosis and treatment. - likely lingering post viral cough, will try suppression techniques, avoiding triggers - benzonatate (TESSALON) 100 MG capsule; Take 1-2 capsules (100-200 mg total) by mouth 3 (three) times daily as needed for cough.  Dispense: 30 capsule; Refill: 0 - if not significantly improved in 2 weeks will get CXR  Clarene Reamer, FNP-BC  [facility], [place]  05/01/2017 5:03 PM

## 2017-05-01 ENCOUNTER — Encounter: Payer: Self-pay | Admitting: Family Medicine

## 2017-12-11 ENCOUNTER — Encounter: Payer: Self-pay | Admitting: Internal Medicine

## 2017-12-11 ENCOUNTER — Ambulatory Visit (INDEPENDENT_AMBULATORY_CARE_PROVIDER_SITE_OTHER): Payer: BC Managed Care – PPO | Admitting: Internal Medicine

## 2017-12-11 VITALS — BP 130/88 | HR 65 | Temp 97.6°F | Ht 76.0 in | Wt 249.0 lb

## 2017-12-11 DIAGNOSIS — I10 Essential (primary) hypertension: Secondary | ICD-10-CM

## 2017-12-11 DIAGNOSIS — Z0000 Encounter for general adult medical examination without abnormal findings: Secondary | ICD-10-CM | POA: Diagnosis not present

## 2017-12-11 DIAGNOSIS — Z125 Encounter for screening for malignant neoplasm of prostate: Secondary | ICD-10-CM

## 2017-12-11 MED ORDER — SILDENAFIL CITRATE 20 MG PO TABS: 60.0000 mg | ORAL_TABLET | Freq: Every day | ORAL | 11 refills | Status: DC | PRN

## 2017-12-11 NOTE — Assessment & Plan Note (Signed)
Healthy Discussed fitness Recommended yearly flu vaccine Recommended shingrix when available Colon due 2021 Discussed PSA---will check

## 2017-12-11 NOTE — Progress Notes (Signed)
Subjective:    Patient ID: Alex Mueller, male    DOB: 1961-02-09, 57 y.o.   MRN: [id number]  HPI Here for physical He feels well No new concerns  No problems with the varicosities No symptoms lately so no Rx  Satisfied with the sildenafil Timing is important  Current Outpatient Medications on File Prior to Visit  Medication Sig Dispense Refill  . EPINEPHrine 0.3 mg/0.3 mL IJ SOAJ injection Inject 0.3 mg into the muscle once.    Marland Kitchen losartan-hydrochlorothiazide (HYZAAR) 100-25 MG tablet Take 1 tablet by mouth daily. 90 tablet 3  . Melatonin 5 MG TABS Take 5-10 mg by mouth at bedtime as needed.    . sildenafil (REVATIO) 20 MG tablet Take 3-5 tablets (60-100 mg total) by mouth daily as needed. 50 tablet 11   No current facility-administered medications on file prior to visit.     Allergies  Allergen Reactions  . Bee Venom Shortness Of Breath  . Lisinopril     REACTION: cough  . Oxycodone Nausea And Vomiting    Past Medical History:  Diagnosis Date  . ED (erectile dysfunction)   . Hx of adenomatous colonic polyps 12/03/2004  . Hyperlipidemia   . Hypertension     Past Surgical History:  Procedure Laterality Date  . COLONOSCOPY    . FOOT SURGERY  07/05/2014  . KNEE ARTHROSCOPY     right knee 1988, left knee 1998  . LUMBAR DISC SURGERY  10/2014    Family History  Problem Relation Age of Onset  . Atrial fibrillation Mother   . Coronary artery disease Father   . Cancer Father        bladder and colon cancer  . Diabetes Father   . Coronary artery disease Paternal Uncle   . Drug abuse Maternal Grandmother   . Hypertension Neg Hx     Social History   Socioeconomic History  . Marital status: Married    Spouse name: Not on file  . Number of children: 2  . Years of education: Not on file  . Highest education level: Not on file  Occupational History  . Occupation: Clinical cytogeneticist: [employer]  . Financial resource  strain: Not on file  . Food insecurity:    Worry: Not on file    Inability: Not on file  . Transportation needs:    Medical: Not on file    Non-medical: Not on file  Tobacco Use  . Smoking status: Never Smoker  . Smokeless tobacco: Never Used  Substance and Sexual Activity  . Alcohol use: No    Alcohol/week: 0.0 oz  . Drug use: No  . Sexual activity: Yes  Lifestyle  . Physical activity:    Days per week: Not on file    Minutes per session: Not on file  . Stress: Not on file  Relationships  . Social connections:    Talks on phone: Not on file    Gets together: Not on file    Attends religious service: Not on file    Active member of club or organization: Not on file    Attends meetings of clubs or organizations: Not on file    Relationship status: Not on file  . Intimate partner violence:    Fear of current or ex partner: Not on file    Emotionally abused: Not on file    Physically abused: Not on file    Forced sexual activity: Not on  file  Other Topics Concern  . Not on file  Social History Narrative  . Not on file   Review of Systems  Constitutional:       Very active at work--walks a lot Wears seat belt  HENT: Negative for dental problem, hearing loss, tinnitus and trouble swallowing.        Keeps up with dentist  Eyes: Negative for visual disturbance.       No diplopia or unilateral vision loss  Respiratory: Negative for chest tightness and shortness of breath.        Occ cough with pollen  Cardiovascular: Negative for chest pain, palpitations and leg swelling.  Gastrointestinal: Negative for blood in stool and constipation.       No heartburn  Endocrine: Negative for polydipsia and polyuria.  Genitourinary: Negative for difficulty urinating and urgency.  Musculoskeletal: Positive for arthralgias and back pain. Negative for joint swelling.       Low back, knees especially painful in AM---better with stretching  Uses advil prn (as much as 800 bid once or twice  a week)  Skin: Negative for rash.       Wife concerned about stable vascular lesion on left calf (purplish macular lesion)  Allergic/Immunologic: Negative for immunocompromised state.  Neurological: Negative for dizziness, syncope, light-headedness and headaches.  Hematological: Negative for adenopathy. Does not bruise/bleed easily.  Psychiatric/Behavioral: Negative for dysphoric mood and sleep disturbance. The patient is not nervous/anxious.        Objective:   Physical Exam  Constitutional: He is oriented to person, place, and time. He appears well-developed and well-nourished. No distress.  HENT:  Head: Normocephalic and atraumatic.  Right Ear: External ear normal.  Left Ear: External ear normal.  Mouth/Throat: Oropharynx is clear and moist. No oropharyngeal exudate.  Eyes: Pupils are equal, round, and reactive to light. Conjunctivae are normal.  Neck: No thyromegaly present.  Cardiovascular: Normal rate, regular rhythm, normal heart sounds and intact distal pulses. Exam reveals no gallop.  No murmur heard. Respiratory: Effort normal and breath sounds normal. No respiratory distress. He has no wheezes. He has no rales.  GI: Soft. There is no tenderness.  Musculoskeletal: He exhibits no edema or tenderness.  Lymphadenopathy:    He has no cervical adenopathy.  Neurological: He is alert and oriented to person, place, and time.  Skin: No rash noted. No erythema.  Psychiatric: He has a normal mood and affect. His behavior is normal.           Assessment & Plan:

## 2017-12-11 NOTE — Assessment & Plan Note (Signed)
BP Readings from Last 3 Encounters:  12/11/17 130/88  04/30/17 (!) 142/88  12/10/16 124/80   Good control No change needed

## 2017-12-12 LAB — COMPREHENSIVE METABOLIC PANEL
ALT: 19 U/L (ref 0–53)
AST: 18 U/L (ref 0–37)
Albumin: 4.4 g/dL (ref 3.5–5.2)
Alkaline Phosphatase: 63 U/L (ref 39–117)
BUN: 15 mg/dL (ref 6–23)
CO2: 32 meq/L (ref 19–32)
Calcium: 9.8 mg/dL (ref 8.4–10.5)
Chloride: 101 mEq/L (ref 96–112)
Creatinine, Ser: 1 mg/dL (ref 0.40–1.50)
GFR: 81.53 mL/min (ref >60.00)
GLUCOSE: 85 mg/dL (ref 70–99)
Potassium: 4.4 mEq/L (ref 3.5–5.1)
Sodium: 139 mEq/L (ref 135–145)
Total Bilirubin: 0.7 mg/dL (ref 0.2–1.2)
Total Protein: 7.3 g/dL (ref 6.0–8.3)

## 2017-12-12 LAB — CBC
HCT: 47.2 % (ref 39.0–52.0)
Hemoglobin: 16 g/dL (ref 13.0–17.0)
MCHC: 33.8 g/dL (ref 30.0–36.0)
MCV: 87.7 fl (ref 78.0–100.0)
Platelets: 300 10*3/uL (ref 150.0–400.0)
RBC: 5.39 Mil/uL (ref 4.22–5.81)
RDW: 13.7 % (ref 11.5–15.5)
WBC: 9.1 10*3/uL (ref 4.0–10.5)

## 2017-12-12 LAB — PSA: PSA: 1.6 ng/mL (ref 0.10–4.00)

## 2017-12-12 LAB — LIPID PANEL
Cholesterol: 205 mg/dL — ABNORMAL HIGH (ref 0–200)
HDL: 38.3 mg/dL — AB (ref >39.00)
LDL CALC: 130 mg/dL — AB (ref 0–99)
NONHDL: 166.34
Total CHOL/HDL Ratio: 5
Triglycerides: 180 mg/dL — ABNORMAL HIGH (ref 0.0–149.0)
VLDL: 36 mg/dL (ref 0.0–40.0)

## 2018-01-15 ENCOUNTER — Other Ambulatory Visit: Payer: Self-pay | Admitting: Internal Medicine

## 2018-02-06 ENCOUNTER — Ambulatory Visit: Payer: BC Managed Care – PPO | Admitting: Family Medicine

## 2018-02-06 ENCOUNTER — Encounter: Payer: Self-pay | Admitting: Family Medicine

## 2018-02-06 DIAGNOSIS — Z91038 Other insect allergy status: Secondary | ICD-10-CM | POA: Diagnosis not present

## 2018-02-06 MED ORDER — TRIAMCINOLONE ACETONIDE 0.1 % EX CREA: 1.0000 "application " | TOPICAL_CREAM | Freq: Two times a day (BID) | CUTANEOUS | 0 refills | Status: DC

## 2018-02-06 NOTE — Patient Instructions (Signed)
Apply topical steroid cream twice daily.  Call if redness spreading , new fever or other signs of infection.

## 2018-02-06 NOTE — Assessment & Plan Note (Signed)
No clear sign of infection. Treat with topical steroid cream.

## 2018-02-06 NOTE — Progress Notes (Signed)
   Subjective:    Patient ID: Alex Mueller, male    DOB: Jan 29, 1961, 57 y.o.   MRN: [id number]  HPI    57 year old male presents for new onset skin lesion on  Left leg x 5 days.  Initially lesion was sore redness.. Bloody clear discharge.   He is not sure if there was a bite or sting.. Noted itching.  Now lesion much less red and less swollen. Minimal pain.  He applied neosporin.  No DM. Not immunocompromised.  No MRSA history, no recurrent boils.    Social History /Family History/Past Medical History reviewed in detail and updated in EMR if needed. Blood pressure 112/68, pulse 68, temperature (!) 97.4 F (36.3 C), temperature source Oral, height 6\' 4"  (1.93 m), weight 255 lb (115.7 kg), SpO2 93 %.   Review of Systems  Constitutional: Negative for fatigue.  HENT: Negative for ear pain.   Respiratory: Negative for shortness of breath.   Cardiovascular: Negative for chest pain.       Objective:   Physical Exam  Constitutional: Vital signs are normal. He appears well-developed and well-nourished.  HENT:  Head: Normocephalic.  Right Ear: Hearing normal.  Left Ear: Hearing normal.  Nose: Nose normal.  Mouth/Throat: Oropharynx is clear and moist and mucous membranes are normal.  Neck: Trachea normal. Carotid bruit is not present. No thyroid mass and no thyromegaly present.  Cardiovascular: Normal rate, regular rhythm and normal pulses. Exam reveals no gallop, no distant heart sounds and no friction rub.  No murmur heard. No peripheral edema  Pulmonary/Chest: Effort normal and breath sounds normal. No respiratory distress.  Skin: Skin is warm, dry and intact. No rash noted.   3 cm diameter lesion with central scab left anterior shin, no central fluctuance, no increased warmth  Psychiatric: He has a normal mood and affect. His speech is normal and behavior is normal. Thought content normal.          Assessment & Plan:

## 2018-09-18 ENCOUNTER — Other Ambulatory Visit: Payer: Self-pay | Admitting: Internal Medicine

## 2018-11-02 ENCOUNTER — Other Ambulatory Visit: Payer: Self-pay | Admitting: Internal Medicine

## 2018-11-02 NOTE — Telephone Encounter (Signed)
Okay to refill? 

## 2018-11-23 ENCOUNTER — Other Ambulatory Visit: Payer: Self-pay | Admitting: Internal Medicine

## 2018-12-18 ENCOUNTER — Encounter: Payer: BC Managed Care – PPO | Admitting: Internal Medicine

## 2018-12-25 ENCOUNTER — Other Ambulatory Visit: Payer: Self-pay

## 2018-12-25 ENCOUNTER — Ambulatory Visit (INDEPENDENT_AMBULATORY_CARE_PROVIDER_SITE_OTHER): Payer: BC Managed Care – PPO | Admitting: Internal Medicine

## 2018-12-25 ENCOUNTER — Encounter: Payer: Self-pay | Admitting: Internal Medicine

## 2018-12-25 VITALS — BP 130/86 | HR 67 | Temp 97.7°F | Ht 76.0 in | Wt 251.0 lb

## 2018-12-25 DIAGNOSIS — I10 Essential (primary) hypertension: Secondary | ICD-10-CM | POA: Diagnosis not present

## 2018-12-25 DIAGNOSIS — Z23 Encounter for immunization: Secondary | ICD-10-CM

## 2018-12-25 DIAGNOSIS — Z0000 Encounter for general adult medical examination without abnormal findings: Secondary | ICD-10-CM

## 2018-12-25 LAB — COMPREHENSIVE METABOLIC PANEL
ALT: 21 U/L (ref 0–53)
AST: 18 U/L (ref 0–37)
Albumin: 4.3 g/dL (ref 3.5–5.2)
Alkaline Phosphatase: 60 U/L (ref 39–117)
BUN: 11 mg/dL (ref 6–23)
CO2: 29 mEq/L (ref 19–32)
Calcium: 9.6 mg/dL (ref 8.4–10.5)
Chloride: 101 mEq/L (ref 96–112)
Creatinine, Ser: 0.96 mg/dL (ref 0.40–1.50)
GFR: 80.12 mL/min (ref >60.00)
Glucose, Bld: 90 mg/dL (ref 70–99)
Potassium: 4.1 mEq/L (ref 3.5–5.1)
Sodium: 137 mEq/L (ref 135–145)
Total Bilirubin: 0.6 mg/dL (ref 0.2–1.2)
Total Protein: 7.2 g/dL (ref 6.0–8.3)

## 2018-12-25 LAB — CBC
HCT: 46.5 % (ref 39.0–52.0)
Hemoglobin: 15.6 g/dL (ref 13.0–17.0)
MCHC: 33.5 g/dL (ref 30.0–36.0)
MCV: 87.6 fl (ref 78.0–100.0)
Platelets: 294 10*3/uL (ref 150.0–400.0)
RBC: 5.31 Mil/uL (ref 4.22–5.81)
RDW: 13.9 % (ref 11.5–15.5)
WBC: 7.7 10*3/uL (ref 4.0–10.5)

## 2018-12-25 MED ORDER — SILDENAFIL CITRATE 20 MG PO TABS: ORAL_TABLET | ORAL | 5 refills | Status: DC

## 2018-12-25 NOTE — Assessment & Plan Note (Signed)
Healthy Discussed fitness Td today Colon due next year Defer PSA till next year Flu vaccine in the fall He wants to wait on the shingrix

## 2018-12-25 NOTE — Addendum Note (Signed)
Addended by: Pilar Grammes on: 12/25/2018 11:54 AM   Modules accepted: Orders

## 2018-12-25 NOTE — Progress Notes (Signed)
Subjective:    Patient ID: Alex Mueller, male    DOB: 09-09-1960, 58 y.o.   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 EPINEPHRINE 0.3 mg/0.3 mL IJ SOAJ injection INJECT 0.3 MLS (0.3 MG TOTAL) INTO THE MUSCLE ONCE. 2 Device 1  . losartan-hydrochlorothiazide (HYZAAR) 100-25 MG tablet TAKE 1 TABLET BY MOUTH EVERY DAY 90 tablet 3  . Melatonin 5 MG TABS Take 5-10 mg by mouth at bedtime as needed.    . sildenafil (REVATIO) 20 MG tablet TAKE 3-5 TABLETS BY MOUTH DAILY AS NEEDED 50 tablet 0  . triamcinolone cream (KENALOG) 0.1 % Apply 1 application topically 2 (two) times daily. 15 g 0   No current facility-administered medications on file prior to visit.     Allergies  Allergen Reactions  . Bee Venom Shortness Of Breath  . Lisinopril     REACTION: cough  . Oxycodone Nausea And Vomiting    Past Medical History:  Diagnosis Date  . ED (erectile dysfunction)   . Hx of adenomatous colonic polyps 12/03/2004  . Hyperlipidemia   . Hypertension     Past Surgical History:  Procedure Laterality Date  . COLONOSCOPY    . FOOT SURGERY  07/05/2014  . KNEE ARTHROSCOPY     right knee 1988, left knee 1998  . LUMBAR DISC SURGERY  10/2014    Family History  Problem Relation Age of Onset  . Atrial fibrillation Mother   . Coronary artery disease Father   . Cancer Father        bladder and colon cancer  . Diabetes Father   . Coronary artery disease Paternal Uncle   . Drug abuse Maternal Grandmother   . Hypertension Neg Hx     Social History   Socioeconomic History  . Marital status: Married    Spouse name: Not on file   . Number of children: 2  . Years of education: Not on file  . Highest education level: Not on file  Occupational History  . Occupation: Clinical cytogeneticist: [employer]  . Financial resource strain: Not on file  . Food insecurity    Worry: Not on file    Inability: Not on file  . Transportation needs    Medical: Not on file    Non-medical: Not on file  Tobacco Use  . Smoking status: Never Smoker  . Smokeless tobacco: Never Used  Substance and Sexual Activity  . Alcohol use: No    Alcohol/week: 0.0 standard drinks  . Drug use: No  . Sexual activity: Yes  Lifestyle  . Physical activity    Days per week: Not on file    Minutes per session: Not on file  . Stress: Not on file  Relationships  . Social Herbalist on phone: Not on file    Gets together: Not on file    Attends religious service: Not on file    Active member of club or organization: Not on file    Attends meetings of clubs or organizations: Not on file    Relationship status: Not on  file  . Intimate partner violence    Fear of current or ex partner: Not on file    Emotionally abused: Not on file    Physically abused: Not on file    Forced sexual activity: Not on file  Other Topics Concern  . Not on file  Social History Narrative  . Not on file   Review of Systems  Constitutional: Negative for fatigue and unexpected weight change.       Wears seat belt  HENT: Negative for dental problem, hearing loss, tinnitus and trouble swallowing.        Keeps up with dentist  Eyes: Negative for visual disturbance.       No diplopia or unilateral vision loss  Respiratory: Negative for cough, chest tightness and shortness of breath.   Cardiovascular: Negative for chest pain, palpitations and leg swelling.  Gastrointestinal: Negative for abdominal pain, blood in stool and constipation.       No heartburn   Endocrine: Negative for polydipsia and polyuria.  Genitourinary:  Negative for difficulty urinating and urgency.  Musculoskeletal: Negative for arthralgias and joint swelling.       AM back pain for an hour or so---improves with moving around Rare advil (400-800)  Skin: Negative for rash.       No suspicious lesions  Allergic/Immunologic: Negative for immunocompromised state.       Rare symptoms if mowing--no meds  Neurological: Negative for dizziness, syncope, light-headedness and headaches.  Hematological: Negative for adenopathy. Does not bruise/bleed easily.  Psychiatric/Behavioral: Negative for dysphoric mood and sleep disturbance. The patient is not nervous/anxious.        Objective:   Physical Exam  Constitutional: He is oriented to person, place, and time. He appears well-developed. No distress.  HENT:  Head: Normocephalic and atraumatic.  Right Ear: External ear normal.  Left Ear: External ear normal.  Mouth/Throat: Oropharynx is clear and moist. No oropharyngeal exudate.  Eyes: Pupils are equal, round, and reactive to light. Conjunctivae are normal.  Neck: No thyromegaly present.  Cardiovascular: Normal rate, regular rhythm, normal heart sounds and intact distal pulses. Exam reveals no gallop.  No murmur heard. Respiratory: Effort normal and breath sounds normal. No respiratory distress. He has no wheezes. He has no rales.  GI: Soft. There is no abdominal tenderness.  Musculoskeletal:        General: No tenderness or edema.  Lymphadenopathy:    He has no cervical adenopathy.  Neurological: He is alert and oriented to person, place, and time.  Skin: No rash noted. No erythema.  Psychiatric: He has a normal mood and affect. His behavior is normal.           Assessment & Plan:

## 2018-12-25 NOTE — Assessment & Plan Note (Signed)
BP Readings from Last 3 Encounters:  12/25/18 130/86  02/06/18 112/68  12/11/17 130/88   Reasonable control Due for labs

## 2019-01-06 ENCOUNTER — Other Ambulatory Visit: Payer: Self-pay | Admitting: Internal Medicine

## 2019-05-31 ENCOUNTER — Other Ambulatory Visit: Payer: Self-pay

## 2019-05-31 ENCOUNTER — Telehealth: Payer: Self-pay

## 2019-05-31 DIAGNOSIS — Z20822 Contact with and (suspected) exposure to covid-19: Secondary | ICD-10-CM

## 2019-05-31 NOTE — Telephone Encounter (Signed)
Yes If he is also positive (likely), we can put him on our phone follow up list

## 2019-05-31 NOTE — Telephone Encounter (Signed)
pts wife tested positive last night for covid. Pt had left v/m requesting cb about getting a covid test. When I called pt back he has already been to [facility] for covid testing. Pt is self quarantining; pt said has cold like symptoms,dry cough,severe H/A that advil helps and loss of taste & smell. UC & ED precautions given and pt voiced understanding. Pt will wait for cb with covid results and pt will cb with any further questions. FYI to Dr Silvio Pate.

## 2019-06-02 LAB — NOVEL CORONAVIRUS, NAA: SARS-CoV-2, NAA: DETECTED — AB

## 2019-06-03 ENCOUNTER — Telehealth: Payer: Self-pay

## 2019-06-03 NOTE — Telephone Encounter (Signed)
Pt positive as of yesterday. Will call Friday 06-04-19 to check on him.

## 2019-06-08 NOTE — Telephone Encounter (Signed)
Left message to call office to see how he is doing.

## 2019-06-08 NOTE — Telephone Encounter (Signed)
Spoke to pt. He said he is feeling better. Still has loss of taste. Advised him that this could last for awhile.

## 2019-08-31 ENCOUNTER — Other Ambulatory Visit: Payer: Self-pay

## 2019-08-31 ENCOUNTER — Encounter: Payer: Self-pay | Admitting: Family Medicine

## 2019-08-31 ENCOUNTER — Ambulatory Visit: Payer: BC Managed Care – PPO | Admitting: Family Medicine

## 2019-08-31 VITALS — BP 142/90 | HR 71 | Temp 97.7°F | Ht 76.0 in | Wt 253.6 lb

## 2019-08-31 DIAGNOSIS — L821 Other seborrheic keratosis: Secondary | ICD-10-CM | POA: Diagnosis not present

## 2019-08-31 DIAGNOSIS — L989 Disorder of the skin and subcutaneous tissue, unspecified: Secondary | ICD-10-CM

## 2019-08-31 NOTE — Patient Instructions (Addendum)
Use a moisturizer in the winter -especially if you are outside  [place] when sunny   [place] office will call you to set up a dermatology appointment   I think the spot on your temple is a benign seb keratosis   I am concerned about the spot on your jaw
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
## 2019-08-31 NOTE — Assessment & Plan Note (Signed)
Small pink non healing scab on R jaw area  Cannot r/o abnormal lesion  Ref made to dermatology to eval and tx   Disc imp of sun protection for skin cancer prevention since he lives outdoors

## 2019-08-31 NOTE — Assessment & Plan Note (Signed)
Almost 1 cm brown/raised on R temple  No signs of infection  Suspect B9 and will observe  Can be treated if bothersome  inst pt to alert [place] if it changes

## 2019-12-29 ENCOUNTER — Ambulatory Visit (INDEPENDENT_AMBULATORY_CARE_PROVIDER_SITE_OTHER): Payer: BC Managed Care – PPO | Admitting: Internal Medicine

## 2019-12-29 ENCOUNTER — Other Ambulatory Visit: Payer: Self-pay

## 2019-12-29 ENCOUNTER — Encounter: Payer: Self-pay | Admitting: Internal Medicine

## 2019-12-29 VITALS — BP 130/84 | HR 63 | Temp 97.3°F | Ht 76.25 in | Wt 242.0 lb

## 2019-12-29 DIAGNOSIS — Z0000 Encounter for general adult medical examination without abnormal findings: Secondary | ICD-10-CM | POA: Diagnosis not present

## 2019-12-29 DIAGNOSIS — Z23 Encounter for immunization: Secondary | ICD-10-CM

## 2019-12-29 DIAGNOSIS — I10 Essential (primary) hypertension: Secondary | ICD-10-CM

## 2019-12-29 MED ORDER — SILDENAFIL CITRATE 20 MG PO TABS: ORAL_TABLET | ORAL | 11 refills | Status: DC

## 2019-12-29 MED ORDER — LOSARTAN POTASSIUM-HCTZ 100-25 MG PO TABS: 1.0000 | ORAL_TABLET | Freq: Every day | ORAL | 3 refills | Status: DC

## 2019-12-29 NOTE — Assessment & Plan Note (Addendum)
Doing well Colon due in September Probably had PSA with insurance work--he will get records for me Recommended COVID vaccine now and flu vaccine in fall--he will consider Has started more exercise now shingrix today

## 2019-12-29 NOTE — Progress Notes (Signed)
Subjective:    Patient ID: Alex Mueller, male    DOB: 26-Feb-1961, 59 y.o.   MRN: [id number]  HPI Here for physical This visit occurred during the SARS-CoV-2 public health emergency.  Safety protocols were in place, including screening questions prior to the visit, additional usage of staff PPE, and extensive cleaning of exam room while observing appropriate contact time as indicated for disinfecting solutions.   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 EPINEPHRINE 0.3 mg/0.3 mL IJ SOAJ injection INJECT 0.3 MLS (0.3 MG TOTAL) INTO THE MUSCLE ONCE. 2 Device 1  . losartan-hydrochlorothiazide (HYZAAR) 100-25 MG tablet TAKE 1 TABLET BY MOUTH EVERY DAY 90 tablet 3  . Melatonin 5 MG TABS Take 5-10 mg by mouth at bedtime as needed.    . sildenafil (REVATIO) 20 MG tablet TAKE 3-5 TABLETS BY MOUTH DAILY AS NEEDED 50 tablet 5  . triamcinolone cream (KENALOG) 0.1 % Apply 1 application topically 2 (two) times daily. 15 g 0   No current facility-administered medications on file prior to visit.    Allergies  Allergen Reactions  . Bee Venom Shortness Of Breath  . Lisinopril     REACTION: cough  . Oxycodone Nausea And Vomiting    Past Medical History:  Diagnosis Date  . ED (erectile dysfunction)   . Hx of adenomatous colonic polyps 12/03/2004  . Hyperlipidemia   . Hypertension     Past Surgical History:  Procedure Laterality Date  . COLONOSCOPY    . FOOT SURGERY  07/05/2014  . KNEE ARTHROSCOPY     right knee 1988, left knee 1998  . LUMBAR DISC SURGERY  10/2014    Family History  Problem Relation Age of Onset  . Atrial fibrillation Mother   . Coronary artery disease Father   . Cancer Father        bladder and colon  cancer  . Diabetes Father   . Coronary artery disease Paternal Uncle   . Drug abuse Maternal Grandmother   . Hypertension Neg Hx     Social History   Socioeconomic History  . Marital status: Married    Spouse name: Not on file  . Number of children: 2  . Years of education: Not on file  . Highest education level: Not on file  Occupational History  . Occupation: Clinical cytogeneticist: [employer]    Comment: Retired  Tobacco Use  . Smoking status: Never Smoker  . Smokeless tobacco: Never Used  Substance and Sexual Activity  . Alcohol use: No    Alcohol/week: 0.0 standard drinks  . Drug use: No  . Sexual activity: Yes  Other Topics Concern  . Not on file  Social History Narrative  . Not on file   Social Determinants of Health   Financial Resource Strain:   . Difficulty of Paying Living Expenses:   Food Insecurity:   . Worried About Charity fundraiser in the Last Year:   . Arboriculturist in the Last Year:   Transportation Needs:   . Film/video editor (Medical):   Marland Kitchen Lack of Transportation (Non-Medical):   Physical Activity:   . Days of Exercise per Week:   . Minutes of Exercise per Session:  Stress:   . Feeling of Stress :   Social Connections:   . Frequency of Communication with Friends and Family:   . Frequency of Social Gatherings with Friends and Family:   . Attends Religious Services:   . Active Member of Clubs or Organizations:   . Attends Archivist Meetings:   Marland Kitchen Marital Status:   Intimate Partner Violence:   . Fear of Current or Ex-Partner:   . Emotionally Abused:   Marland Kitchen Physically Abused:   . Sexually Abused:    Review of Systems  Constitutional: Negative for fatigue and unexpected weight change.       Wears seat belt  HENT: Negative for hearing loss and tinnitus.        Keeps up with dentist   Eyes: Negative for visual disturbance.       No diplopia or unilateral vision loss  Respiratory: Negative for  cough, chest tightness and shortness of breath.   Cardiovascular: Negative for chest pain, palpitations and leg swelling.       Has started walking and light weight training  Gastrointestinal: Negative for blood in stool and constipation.       No heartburn  Endocrine: Negative for polydipsia and polyuria.  Genitourinary: Negative for difficulty urinating and urgency.       Satisfied with sildenafil  Musculoskeletal: Negative for arthralgias, back pain and joint swelling.       Some morning stiffness---goes away quickly  Skin: Negative for rash.       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  Neurological: Negative for dizziness, syncope, light-headedness and headaches.  Hematological: Negative for adenopathy. Does not bruise/bleed easily.  Psychiatric/Behavioral: Negative for dysphoric mood and sleep disturbance. The patient is not nervous/anxious.        Objective:   Physical Exam  Constitutional: He is oriented to person, place, and time. No distress.  HENT:  Head: Normocephalic and atraumatic.  Right Ear: Tympanic membrane and ear canal normal.  Left Ear: Tympanic membrane and ear canal normal.  Mouth/Throat: Mucous membranes are moist.  Eyes: Pupils are equal, round, and reactive to light. Conjunctivae are normal.  Cardiovascular: Normal rate, regular rhythm and normal pulses. Exam reveals no gallop.  No murmur heard. Respiratory: Effort normal and breath sounds normal. He has no wheezes. He has no rales.  GI: Soft. Normal appearance. There is no abdominal tenderness.  Musculoskeletal:     Cervical back: Neck supple.     Right lower leg: No edema.     Left lower leg: No edema.  Lymphadenopathy:    He has no cervical adenopathy.  Neurological: He is alert and oriented to person, place, and time.  Skin: Skin is warm. No rash noted.  Psychiatric: His behavior is normal. Mood  normal.           Assessment & Plan:

## 2019-12-29 NOTE — Addendum Note (Signed)
Addended by: Pilar Grammes on: 12/29/2019 11:46 AM   Modules accepted: Orders

## 2019-12-29 NOTE — Patient Instructions (Signed)
Please get a copy of the blood work you had for the insurance physical.

## 2019-12-29 NOTE — Assessment & Plan Note (Signed)
BP Readings from Last 3 Encounters:  12/29/19 130/84  08/31/19 (!) 142/90  12/25/18 130/86   Good control Will check insurance labs when he gets them

## 2020-03-29 ENCOUNTER — Ambulatory Visit (INDEPENDENT_AMBULATORY_CARE_PROVIDER_SITE_OTHER): Payer: BC Managed Care – PPO

## 2020-03-29 ENCOUNTER — Other Ambulatory Visit: Payer: Self-pay

## 2020-03-29 DIAGNOSIS — Z23 Encounter for immunization: Secondary | ICD-10-CM

## 2020-03-29 NOTE — Progress Notes (Signed)
Per orders of Dr. Viviana Simpler, injection of 2nd Shingrix in Left deltoid given by Lurlean Nanny.  Patient tolerated injection well.

## 2020-05-26 MED ORDER — SILDENAFIL CITRATE 20 MG PO TABS: ORAL_TABLET | ORAL | 11 refills | Status: DC

## 2020-06-19 ENCOUNTER — Telehealth: Payer: Self-pay | Admitting: *Deleted

## 2020-06-19 NOTE — Telephone Encounter (Signed)
Please let him know that I recommend he get the vaccine regardless of his antibody status.

## 2020-06-19 NOTE — Telephone Encounter (Signed)
Left detailed message on VM per DPR. 

## 2020-06-19 NOTE — Telephone Encounter (Signed)
Patient called stating that he had covid last year before Thanksgiving. Patient stated that he had lab work about a month ago to check his antibodies and the results were positive. Patient wants to know if Dr. Silvio Pate thinks that he should go ahead and get the vaccines? Patient stated that he has not been vaccinated because he has had covid.and his antibody results.

## 2020-08-14 ENCOUNTER — Telehealth: Payer: Self-pay | Admitting: *Deleted

## 2020-08-14 ENCOUNTER — Encounter: Payer: Self-pay | Admitting: *Deleted

## 2020-08-14 DIAGNOSIS — Z86010 Personal history of colonic polyps: Secondary | ICD-10-CM

## 2020-08-14 NOTE — Telephone Encounter (Signed)
Patient is scheduled for recall colonoscopy on 08/23/20 at 11:00 am, 10:00 am arrival. He is advised that he will need someone 18 or older to bring him, stay for procedure and take him home since he will be sedated.  He is also advised of time/date/location and prep for procedure.   Since there are no previsits available between now and procedure date, I have gone over patient's medication list, allergy list, medical history, surgical history, family history and social history. All have been updated in our system. No allergies to soy, eggs, no history of difficult intubation. Patient is not on any anticoagulation, any oxygen or any diet medications.

## 2020-08-21 ENCOUNTER — Other Ambulatory Visit: Payer: Self-pay

## 2020-08-21 ENCOUNTER — Ambulatory Visit: Payer: BC Managed Care – PPO | Admitting: Family Medicine

## 2020-08-21 ENCOUNTER — Telehealth: Payer: Self-pay

## 2020-08-21 ENCOUNTER — Encounter: Payer: Self-pay | Admitting: Internal Medicine

## 2020-08-21 ENCOUNTER — Encounter: Payer: Self-pay | Admitting: Family Medicine

## 2020-08-21 VITALS — BP 120/82 | HR 76 | Temp 97.8°F | Ht 76.25 in | Wt 246.0 lb

## 2020-08-21 DIAGNOSIS — I10 Essential (primary) hypertension: Secondary | ICD-10-CM | POA: Diagnosis not present

## 2020-08-21 LAB — BASIC METABOLIC PANEL
BUN: 13 mg/dL (ref 6–23)
CO2: 31 mEq/L (ref 19–32)
Calcium: 9.6 mg/dL (ref 8.4–10.5)
Chloride: 100 mEq/L (ref 96–112)
Creatinine, Ser: 0.9 mg/dL (ref 0.40–1.50)
GFR: 92.61 mL/min (ref >60.00)
Glucose, Bld: 74 mg/dL (ref 70–99)
Potassium: 4.3 mEq/L (ref 3.5–5.1)
Sodium: 136 mEq/L (ref 135–145)

## 2020-08-21 NOTE — Telephone Encounter (Signed)
[place] Night - Client TELEPHONE ADVICE RECORD [facility] Patient Name: [name] Gender: Male DOB: 14-Jun-1961 Age: 60 Y 10 M 3 D Return Phone Number: [phone] (Primary), [phone] (Secondary) Address: City/State/Zip: [place] [address] Client [facility] Night - Client Client Site [place] Physician Viviana Simpler- MD Contact Type Call Who Is Calling Patient / Member / Family / Caregiver Call Type Triage / Clinical Relationship To Patient Self Return Phone Number [phone] (Primary) Chief Complaint Blood Pressure High Reason for Call Symptomatic / Request for [place] states his blood pressure has been elevated and having ringing in ears Translation No Nurse Assessment Nurse: Ysidro Evert, RN, Levada Dy Date/Time (Eastern Time): 08/21/2020 8:42:15 AM Confirm and document reason for call. If symptomatic, describe symptoms. ---Caller states his blood pressure has been elevated. This morning 155/93. He has a headache and he feels like his ears are ringing at times Does the patient have any new or worsening symptoms? ---Yes Will a triage be completed? ---Yes Related visit to physician within the last 2 weeks? ---No Does the PT have any chronic conditions? (i.e. diabetes, asthma, this includes High risk factors for pregnancy, etc.) ---Yes List chronic conditions. ---hypertension Is this a behavioral health or substance abuse call? ---No Guidelines Guideline Title Affirmed Question Affirmed Notes Nurse Date/Time (Eastern Time) Blood Pressure - High Systolic BP >= 993 OR Diastolic >= 716 Earleen Reaper 08/21/2020 8:43:48 AM Disp. Time Eilene Ghazi Time) Disposition Final User 08/21/2020 8:46:42 AM SEE PCP WITHIN 3 DAYS Yes Ysidro Evert, RN, Marin Shutter Disagree/Comply Comply Caller Understands Yes PreDisposition Did not know what to do PLEASE NOTE: All timestamps  contained within this report are represented as [place] Standard Time. CONFIDENTIALTY NOTICE: This fax transmission is intended only for the addressee. It contains information that is legally privileged, confidential or otherwise protected from use or disclosure. If you are not the intended recipient, you are strictly prohibited from reviewing, disclosing, copying using or disseminating any of this information or taking any action in reliance on or regarding this information. If you have received this fax in error, please notify us immediately by telephone so that we can arrange for its return to [place]. Phone: [phone], Toll-Free: [phone], Fax: [phone] Page: 2 of 2 Call Id: [id number] Care Advice Given Per Guideline SEE PCP WITHIN 3 DAYS: * You need to be seen within 2 or 3 days. CARE ADVICE given per High Blood Pressure (Adult) guideline. CALL BACK IF: * Weakness or numbness of the face, arm or leg on one side of the body occurs * Difficulty walking, difficulty talking, or severe headache occurs * Chest pain or difficulty breathing occurs * Your blood pressure is over 180/110 * You become worse

## 2020-08-21 NOTE — Telephone Encounter (Signed)
Patient has an appointment with Dr. Damita Dunnings today at 12:30

## 2020-08-21 NOTE — Progress Notes (Signed)
This visit occurred during the SARS-CoV-2 public health emergency.  Safety protocols were in place, including screening questions prior to the visit, additional usage of staff PPE, and extensive cleaning of exam room while observing appropriate contact time as indicated for disinfecting solutions.  Still on losartan and HCTZ at baseline.  Some ringing in B ears over the last few weeks.  Some HA last night- checked BP last night and this AM.  170s/90s last night, then 150s/90s today.  BP is clearly better here today at [place].  He had used a home arm cuff.  No other recent BP checks at home.  No CP, SOB, BLE edema.    Prev HA was B, temporal, not severe.  He doesn't typically have HA often.  No symptoms now.  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 No vertigo.  No HA now.    Meds, vitals, and allergies reviewed.   ROS: Per HPI unless specifically indicated in ROS section   GEN: nad, alert and oriented HEENT: PERRL EOMI , TM wnl B NECK: supple w/o LA CV: rrr PULM: ctab, no inc wob ABD: soft, +bs EXT: no edema SKIN: well perfused.   CN 2-12 wnl B, S/S/DTR wnl x4 Weber wnl B and rhinne wnl B.  Air greater than bone conduction bilaterally.

## 2020-08-21 NOTE — Telephone Encounter (Signed)
Noted Will await his evaluation

## 2020-08-21 NOTE — Patient Instructions (Addendum)
Go to the lab on the way out.   If you have mychart we'll likely use that to update you.    Don't change your meds for now.  Try to calibrate your BP cuff when possible.  If the headaches return or get worse then let us know.  Take care.  Glad to see you.

## 2020-08-22 ENCOUNTER — Telehealth: Payer: Self-pay | Admitting: Internal Medicine

## 2020-08-22 NOTE — Telephone Encounter (Signed)
Pt states he just recently got his pfizer vaccine.  He has had both.

## 2020-08-23 ENCOUNTER — Other Ambulatory Visit: Payer: Self-pay

## 2020-08-23 ENCOUNTER — Ambulatory Visit (AMBULATORY_SURGERY_CENTER): Payer: BC Managed Care – PPO | Admitting: Internal Medicine

## 2020-08-23 ENCOUNTER — Encounter: Payer: Self-pay | Admitting: Internal Medicine

## 2020-08-23 VITALS — BP 119/81 | HR 68 | Temp 96.8°F | Resp 15 | Ht 77.0 in | Wt 245.0 lb

## 2020-08-23 DIAGNOSIS — K5520 Angiodysplasia of colon without hemorrhage: Secondary | ICD-10-CM

## 2020-08-23 DIAGNOSIS — K635 Polyp of colon: Secondary | ICD-10-CM | POA: Diagnosis not present

## 2020-08-23 DIAGNOSIS — Z86010 Personal history of colonic polyps: Secondary | ICD-10-CM

## 2020-08-23 DIAGNOSIS — D122 Benign neoplasm of ascending colon: Secondary | ICD-10-CM

## 2020-08-23 HISTORY — DX: Angiodysplasia of colon without hemorrhage: K55.20

## 2020-08-23 MED ORDER — SODIUM CHLORIDE 0.9 % IV SOLN: 500.0000 mL | Freq: Once | INTRAVENOUS | Status: DC

## 2020-08-23 NOTE — Progress Notes (Signed)
Called to room to assist during endoscopic procedure.  Patient ID and intended procedure confirmed with present staff. Received instructions for my participation in the procedure from the performing physician.  

## 2020-08-23 NOTE — Assessment & Plan Note (Signed)
Benign exam.  Blood pressure controlled.  Reasonable to check routine labs today.  See notes on labs.  It may be that his blood pressure cuff is reading artificially high at home.  I asked him to calibrate that.  Unclear if the headache and tenderness are related.  He has a normal neurologic exam and is okay for outpatient follow-up.  No urgent issues identified.  I think it makes sense to check his labs, have him calibrate his blood pressure cuff, update [place] as needed.  He agrees.  Routed to PCP as FYI.

## 2020-08-23 NOTE — Op Note (Signed)
[facility] Patient Name: Alex Mueller Procedure Date: 08/23/2020 10:30 AM MRN: [id number] Endoscopist: Gatha Mayer , MD Age: 60 Referring MD:  Date of Birth: 22-May-1961 Gender: Male Account #: [id number] Procedure:                Colonoscopy Indications:              Surveillance: Personal history of adenomatous                            polyps on last colonoscopy 5 years ago Medicines:                Propofol per Anesthesia, Monitored Anesthesia Care Procedure:                Pre-Anesthesia Assessment:                           - Prior to the procedure, a History and Physical                            was performed, and patient medications and                            allergies were reviewed. The patient's tolerance of                            previous anesthesia was also reviewed. The risks                            and benefits of the procedure and the sedation                            options and risks were discussed with the patient.                            All questions were answered, and informed consent                            was obtained. Prior Anticoagulants: The patient has                            taken no previous anticoagulant or antiplatelet                            agents. ASA Grade Assessment: II - A patient with                            mild systemic disease. After reviewing the risks                            and benefits, the patient was deemed in                            satisfactory condition to undergo the procedure.  After obtaining informed consent, the colonoscope                            was passed under direct vision. Throughout the                            procedure, the patient's blood pressure, pulse, and                            oxygen saturations were monitored continuously. The                            Olympus CF-HQ190 [id number]) Colonoscope was                            introduced  through the anus and advanced to the the                            cecum, identified by appendiceal orifice and                            ileocecal valve. The colonoscopy was performed                            without difficulty. The patient tolerated the                            procedure well. The quality of the bowel                            preparation was good. The ileocecal valve,                            appendiceal orifice, and rectum were photographed.                            The bowel preparation used was SuTAb via split dose                            instruction. Scope In: 10:44:12 AM Scope Out: 10:58:55 AM Scope Withdrawal Time: 0 hours 11 minutes 20 seconds  Total Procedure Duration: 0 hours 14 minutes 43 seconds  Findings:                 The perianal and digital rectal examinations were                            normal. Pertinent negatives include normal prostate                            (size, shape, and consistency).                           A diminutive polyp was found in the ascending  colon. The polyp was sessile. The polyp was removed                            with a cold snare. Resection and retrieval were                            complete. Verification of patient identification                            for the specimen was done. Estimated blood loss was                            minimal.                           A single small angiodysplastic lesion without                            bleeding was found in the cecum.                           The exam was otherwise without abnormality on                            direct and retroflexion views. Complications:            No immediate complications. Estimated Blood Loss:     Estimated blood loss was minimal. Impression:               - One diminutive polyp in the ascending colon,                            removed with a cold snare. Resected and retrieved.                            - A single non-bleeding colonic angiodysplastic                            lesion.                           - The examination was otherwise normal on direct                            and retroflexion views.                           - Personal history of colonic polyps diminutive                            adenomas 2006 and 2016. And FHx CRCA father age 3 Recommendation:           - Patient has a contact number available for                            emergencies. The signs and symptoms  of potential                            delayed complications were discussed with the                            patient. Return to normal activities tomorrow.                            Written discharge instructions were provided to the                            patient.                           - Continue present medications.                           - Resume previous diet.                           - Repeat colonoscopy in 5 years for surveillance. Gatha Mayer, MD 08/23/2020 11:16:17 AM This report has been signed electronically.

## 2020-08-23 NOTE — Progress Notes (Signed)
VS taken by C.W. 

## 2020-08-23 NOTE — Progress Notes (Signed)
Report to PACU, RN, vss, BBS= Clear.  

## 2020-08-23 NOTE — Patient Instructions (Addendum)
HANDOUTS PROVIDED TODAY:  POLYPS  I found and removed one tiny polyp. There was a tiny spot of superficial bood vessels also - called angiodysplasia or AVM. Usually no problem. Sometimes may bleed - if so can be fixed easily. All else ok.  Your next routine colonoscopy should be in 5 years - 2027.  I appreciate the opportunity to care for you. Gatha Mayer, MD, FACG  YOU HAD AN ENDOSCOPIC PROCEDURE TODAY AT [facility]:   Refer to the procedure report that was given to you for any specific questions about what was found during the examination.  If the procedure report does not answer your questions, please call your gastroenterologist to clarify.  If you requested that your care partner not be given the details of your procedure findings, then the procedure report has been included in a sealed envelope for you to review at your convenience later.  YOU SHOULD EXPECT: Some feelings of bloating in the abdomen. Passage of more gas than usual.  Walking can help get rid of the air that was put into your GI tract during the procedure and reduce the bloating. If you had a lower endoscopy (such as a colonoscopy or flexible sigmoidoscopy) you may notice spotting of blood in your stool or on the toilet paper. If you underwent a bowel prep for your procedure, you may not have a normal bowel movement for a few days.  Please Note:  You might notice some irritation and congestion in your nose or some drainage.  This is from the oxygen used during your procedure.  There is no need for concern and it should clear up in a day or so.  SYMPTOMS TO REPORT IMMEDIATELY:   Following lower endoscopy (colonoscopy or flexible sigmoidoscopy):  Excessive amounts of blood in the stool  Significant tenderness or worsening of abdominal pains  Swelling of the abdomen that is new, acute  Fever of 100F or higher  For urgent or emergent issues, a gastroenterologist can be reached at any hour by calling  [phone]. Do not use MyChart messaging for urgent concerns.    DIET:  We do recommend a small meal at first, but then you may proceed to your regular diet.  Drink plenty of fluids but you should avoid alcoholic beverages for 24 hours.  ACTIVITY:  You should plan to take it easy for the rest of today and you should NOT DRIVE or use heavy machinery until tomorrow (because of the sedation medicines used during the test).    FOLLOW UP: Our staff will call the number listed on your records Friday morning between 7:15 am and 8:15 am to check on you and address any questions or concerns that you may have regarding the information given to you following your procedure. If we do not reach you, we will leave a message.  We will attempt to reach you two times.  During this call, we will ask if you have developed any symptoms of COVID 19. If you develop any symptoms (ie: fever, flu-like symptoms, shortness of breath, cough etc.) before then, please call [phone].  If you test positive for Covid 19 in the 2 weeks post procedure, please call and report this information to [place].    If any biopsies were taken you will be contacted by phone or by letter within the next 1-3 weeks.  Please call us at [phone] if you have not heard about the biopsies in 3 weeks.    SIGNATURES/CONFIDENTIALITY: You  and/or your care partner have signed paperwork which will be entered into your electronic medical record.  These signatures attest to the fact that that the information above on your After Visit Summary has been reviewed and is understood.  Full responsibility of the confidentiality of this discharge information lies with you and/or your care-partner.

## 2020-08-25 ENCOUNTER — Telehealth: Payer: Self-pay

## 2020-08-25 NOTE — Telephone Encounter (Signed)
  Follow up Call-  Call back number 08/23/2020  Post procedure Call Back phone  # [phone]  Permission to leave phone message Yes  Some recent data might be hidden     Patient questions:  Do you have a fever, pain , or abdominal swelling? No. Pain Score  0 *  Have you tolerated food without any problems? Yes.    Have you been able to return to your normal activities? Yes.    Do you have any questions about your discharge instructions: Diet   No. Medications  No. Follow up visit  No.  Do you have questions or concerns about your Care? No.  Actions: * If pain score is 4 or above: No action needed, pain <4.  1. Have you developed a fever since your procedure? no  2.   Have you had an respiratory symptoms (SOB or cough) since your procedure? no  3.   Have you tested positive for COVID 19 since your procedure no  4.   Have you had any family members/close contacts diagnosed with the COVID 19 since your procedure?  no   If yes to any of these questions please route to Joylene John, RN and Joella Prince, RN

## 2020-09-11 ENCOUNTER — Encounter: Payer: Self-pay | Admitting: Internal Medicine

## 2020-12-29 ENCOUNTER — Encounter: Payer: BC Managed Care – PPO | Admitting: Internal Medicine

## 2021-01-12 ENCOUNTER — Ambulatory Visit (INDEPENDENT_AMBULATORY_CARE_PROVIDER_SITE_OTHER): Payer: BC Managed Care – PPO | Admitting: Internal Medicine

## 2021-01-12 ENCOUNTER — Other Ambulatory Visit: Payer: Self-pay

## 2021-01-12 ENCOUNTER — Encounter: Payer: Self-pay | Admitting: Internal Medicine

## 2021-01-12 VITALS — BP 138/86 | HR 67 | Temp 97.8°F | Ht 76.0 in | Wt 240.8 lb

## 2021-01-12 DIAGNOSIS — I10 Essential (primary) hypertension: Secondary | ICD-10-CM

## 2021-01-12 DIAGNOSIS — Z0000 Encounter for general adult medical examination without abnormal findings: Secondary | ICD-10-CM | POA: Diagnosis not present

## 2021-01-12 DIAGNOSIS — Z125 Encounter for screening for malignant neoplasm of prostate: Secondary | ICD-10-CM | POA: Diagnosis not present

## 2021-01-12 LAB — COMPREHENSIVE METABOLIC PANEL
ALT: 17 U/L (ref 0–53)
AST: 16 U/L (ref 0–37)
Albumin: 4.3 g/dL (ref 3.5–5.2)
Alkaline Phosphatase: 65 U/L (ref 39–117)
BUN: 14 mg/dL (ref 6–23)
CO2: 29 mEq/L (ref 19–32)
Calcium: 9.4 mg/dL (ref 8.4–10.5)
Chloride: 101 mEq/L (ref 96–112)
Creatinine, Ser: 0.99 mg/dL (ref 0.40–1.50)
GFR: 82.37 mL/min (ref >60.00)
Glucose, Bld: 76 mg/dL (ref 70–99)
Potassium: 4.1 mEq/L (ref 3.5–5.1)
Sodium: 138 mEq/L (ref 135–145)
Total Bilirubin: 0.8 mg/dL (ref 0.2–1.2)
Total Protein: 6.9 g/dL (ref 6.0–8.3)

## 2021-01-12 LAB — CBC
HCT: 45.2 % (ref 39.0–52.0)
Hemoglobin: 15.5 g/dL (ref 13.0–17.0)
MCHC: 34.2 g/dL (ref 30.0–36.0)
MCV: 86.2 fl (ref 78.0–100.0)
Platelets: 264 10*3/uL (ref 150.0–400.0)
RBC: 5.24 Mil/uL (ref 4.22–5.81)
RDW: 13.9 % (ref 11.5–15.5)
WBC: 8.1 10*3/uL (ref 4.0–10.5)

## 2021-01-12 LAB — LIPID PANEL
Cholesterol: 173 mg/dL (ref 0–200)
HDL: 41.4 mg/dL (ref >39.00)
LDL Cholesterol: 108 mg/dL — ABNORMAL HIGH (ref 0–99)
NonHDL: 131.92
Total CHOL/HDL Ratio: 4
Triglycerides: 120 mg/dL (ref 0.0–149.0)
VLDL: 24 mg/dL (ref 0.0–40.0)

## 2021-01-12 LAB — PSA: PSA: 2.15 ng/mL (ref 0.10–4.00)

## 2021-01-12 MED ORDER — LOSARTAN POTASSIUM-HCTZ 100-25 MG PO TABS
1.0000 | ORAL_TABLET | Freq: Every day | ORAL | 3 refills | Status: DC
Start: 2021-01-12 — End: 2022-01-16

## 2021-01-12 NOTE — Progress Notes (Signed)
Subjective:    Patient ID: Alex Mueller, male    DOB: 1961-02-26, 60 y.o.   MRN: [id number]  HPI Here for physical This visit occurred during the SARS-CoV-2 public health emergency.  Safety protocols were in place, including screening questions prior to the visit, additional usage of staff PPE, and extensive cleaning of exam room while observing appropriate contact time as indicated for disinfecting solutions.   Doing well No concerns  Current Outpatient Medications on File Prior to Visit  Medication Sig Dispense Refill   Ascorbic Acid (VITAMIN C) 1000 MG tablet Take 1,000 mg by mouth daily.     aspirin EC 81 MG tablet Take 81 mg by mouth daily. Swallow whole.     Cholecalciferol (VITAMIN D-3) 125 MCG (5000 UT) TABS Take 1 tablet by mouth daily.     EPINEPHRINE 0.3 mg/0.3 mL IJ SOAJ injection INJECT 0.3 MLS (0.3 MG TOTAL) INTO THE MUSCLE ONCE. 2 Device 1   losartan-hydrochlorothiazide (HYZAAR) 100-25 MG tablet Take 1 tablet by mouth daily. 90 tablet 3   Melatonin 5 MG TABS Take 5-10 mg by mouth at bedtime as needed.     sildenafil (REVATIO) 20 MG tablet TAKE 3-5 TABLETS BY MOUTH DAILY AS NEEDED 50 tablet 11   Zinc 50 MG TABS Take 1 tablet by mouth daily.     No current facility-administered medications on file prior to visit.    Allergies  Allergen Reactions   Bee Venom Shortness Of Breath   Lisinopril     REACTION: cough   Oxycodone Nausea And Vomiting    Past Medical History:  Diagnosis Date   Angiodysplasia of cecum 08/23/2020   ED (erectile dysfunction)    Hx of adenomatous colonic polyps 12/03/2004   Hyperlipidemia    Hypertension     Past Surgical History:  Procedure Laterality Date   COLONOSCOPY     FOOT SURGERY  07/05/2014   KNEE ARTHROSCOPY     right knee 1988, left knee [place] SURGERY  10/2014    Family History  Problem Relation Age of Onset   Atrial fibrillation Mother    Coronary artery disease Father    Cancer Father        bladder and  colon cancer   Diabetes Father    Colon cancer Father    Coronary artery disease Paternal Uncle    Drug abuse Maternal Grandmother    Hypertension Neg Hx    Esophageal cancer Neg Hx    Rectal cancer Neg Hx    Stomach cancer Neg Hx     Social History   Socioeconomic History   Marital status: Married    Spouse name: Not on file   Number of children: 2   Years of education: Not on file   Highest education level: Not on file  Occupational History   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  Tobacco Use   Smoking status: Never   Smokeless tobacco: Never  Scientific laboratory technician Use: Not on file  Substance and Sexual Activity   Alcohol use: No    Alcohol/week: 0.0 standard drinks   Drug use: No   Sexual activity: Yes  Other Topics Concern   Not on file  Social History Narrative   Retired [employer]   Social Determinants of Health   Financial Resource Strain: Not on file  Food Insecurity: Not on file  Transportation Needs: Not on file  Physical Activity: Not on file  Stress: Not on file  Social Connections: Not on file  Intimate Partner Violence: Not on file   Review of Systems  Constitutional:  Negative for fatigue.       Has lost a few pounds Does exercise some Wears seat belt  HENT:  Negative for dental problem, hearing loss and tinnitus.        Keeps up with dentist  Eyes:  Negative for visual disturbance.        Diplopia or unilateral vision loss  Respiratory:  Negative for cough, chest tightness and shortness of breath.   Cardiovascular:  Negative for chest pain, palpitations and leg swelling.  Gastrointestinal:  Negative for blood in stool and constipation.       No heartburn  Endocrine: Negative for polydipsia and polyuria.  Genitourinary:  Negative for difficulty urinating and urgency.       Satisfied with sildenafil  Musculoskeletal:  Negative for arthralgias, back pain and joint  swelling.  Skin:  Negative for rash.       BSC removed from left leg  Allergic/Immunologic: Negative for environmental allergies and immunocompromised state.  Neurological:  Negative for dizziness, syncope, light-headedness and headaches.  Hematological:  Negative for adenopathy. Does not bruise/bleed easily.  Psychiatric/Behavioral:  Negative for dysphoric mood and sleep disturbance. The patient is not nervous/anxious.       Objective:   Physical Exam Constitutional:      Appearance: Normal appearance.  HENT:     Right Ear: Tympanic membrane and ear canal normal.     Left Ear: Tympanic membrane and ear canal normal.     Mouth/Throat:     Pharynx: No oropharyngeal exudate or posterior oropharyngeal erythema.  Eyes:     Conjunctiva/sclera: Conjunctivae normal.     Pupils: Pupils are equal, round, and reactive to light.  Cardiovascular:     Rate and Rhythm: Normal rate and regular rhythm.     Pulses: Normal pulses.     Heart sounds: No murmur heard.   No gallop.  Pulmonary:     Effort: Pulmonary effort is normal.     Breath sounds: Normal breath sounds. No wheezing or rales.  Abdominal:     Palpations: Abdomen is soft.     Tenderness: There is no abdominal tenderness.  Musculoskeletal:     Cervical back: Neck supple.     Right lower leg: No edema.     Left lower leg: No edema.  Lymphadenopathy:     Cervical: No cervical adenopathy.  Skin:    General: Skin is warm.     Findings: No rash.  Neurological:     General: No focal deficit present.     Mental Status: He is alert and oriented to person, place, and time.  Psychiatric:        Mood and Affect: Mood normal.        Behavior: Behavior normal.           Assessment & Plan:

## 2021-01-12 NOTE — Assessment & Plan Note (Signed)
BP Readings from Last 3 Encounters:  01/12/21 138/86  08/23/20 119/81  08/21/20 120/82   Good control on losartan/HCTZ

## 2021-01-12 NOTE — Assessment & Plan Note (Signed)
Healthy Working on fitness Just had colon --due again in 2027 Discussed PSA --will check COVID booster in the fall Recommended flu vaccine

## 2021-03-29 ENCOUNTER — Encounter (HOSPITAL_BASED_OUTPATIENT_CLINIC_OR_DEPARTMENT_OTHER): Payer: Self-pay | Admitting: Obstetrics and Gynecology

## 2021-03-29 ENCOUNTER — Ambulatory Visit (HOSPITAL_COMMUNITY)
Admission: EM | Admit: 2021-03-29 | Discharge: 2021-03-29 | Disposition: A | Discharge status: Home / Self Care | Payer: BC Managed Care – PPO

## 2021-03-29 ENCOUNTER — Telehealth: Payer: Self-pay | Admitting: *Deleted

## 2021-03-29 ENCOUNTER — Emergency Department (HOSPITAL_BASED_OUTPATIENT_CLINIC_OR_DEPARTMENT_OTHER): Payer: BC Managed Care – PPO

## 2021-03-29 ENCOUNTER — Other Ambulatory Visit: Payer: Self-pay

## 2021-03-29 ENCOUNTER — Emergency Department (HOSPITAL_BASED_OUTPATIENT_CLINIC_OR_DEPARTMENT_OTHER)
Admission: EM | Admit: 2021-03-29 | Discharge: 2021-03-29 | Disposition: A | Discharge status: Home / Self Care | Payer: BC Managed Care – PPO | Attending: Emergency Medicine | Admitting: Emergency Medicine

## 2021-03-29 DIAGNOSIS — Z7982 Long term (current) use of aspirin: Secondary | ICD-10-CM | POA: Diagnosis not present

## 2021-03-29 DIAGNOSIS — M79661 Pain in right lower leg: Secondary | ICD-10-CM | POA: Diagnosis present

## 2021-03-29 DIAGNOSIS — I83891 Varicose veins of right lower extremities with other complications: Secondary | ICD-10-CM

## 2021-03-29 DIAGNOSIS — I10 Essential (primary) hypertension: Secondary | ICD-10-CM | POA: Insufficient documentation

## 2021-03-29 DIAGNOSIS — I8391 Asymptomatic varicose veins of right lower extremity: Secondary | ICD-10-CM | POA: Diagnosis not present

## 2021-03-29 DIAGNOSIS — Z79899 Other long term (current) drug therapy: Secondary | ICD-10-CM | POA: Diagnosis not present

## 2021-03-29 NOTE — ED Triage Notes (Signed)
Patient reports right sided calf pain that is an 8/10 and increasing pain with movement. Patient states the pain started x2 weeks ago and has gotten worse in the last two days. States pain runs from popliteal region down to ankle.

## 2021-03-29 NOTE — Telephone Encounter (Signed)
Patient is currently at the ED. 

## 2021-03-29 NOTE — Telephone Encounter (Signed)
PLEASE NOTE: All timestamps contained within this report are represented as [place] Standard Time. CONFIDENTIALTY NOTICE: This fax transmission is intended only for the addressee. It contains information that is legally privileged, confidential or otherwise protected from use or disclosure. If you are not the intended recipient, you are strictly prohibited from reviewing, disclosing, copying using or disseminating any of this information or taking any action in reliance on or regarding this information. If you have received this fax in error, please notify us immediately by telephone so that we can arrange for its return to [place]. Phone: [phone], Toll-Free: [phone], Fax: [phone] Page: 1 of 2 Call Id: [id number] [facility] - Client TELEPHONE ADVICE RECORD [facility] Patient Name: Alex Mueller Gender: Male DOB: Nov 23, 1960 Age: 60 Y 37 M 11 D Return Phone Number: [phone] (Primary), [phone] (Secondary) Address: City/ State/ Zip: [place]  [address] Client [facility] - Client Client Site [facility] - Day Physician Viviana Simpler- MD Contact Type Call Who Is Calling Patient / Member / Family / Caregiver Call Type Triage / Clinical Relationship To Patient Self Return Phone Number [phone] (Primary) Chief Complaint Leg Swelling And Edema Reason for Call Symptomatic / Request for Health Information Initial Comment Caller has Sx of swelling behind knee and red and warm [place] facility with ED [address] of [address] close to his home Translation No Nurse Assessment Nurse: Jimmye Norman, RN, [place] Date/Time (Eastern Time): 03/29/2021 8:50:08 AM Confirm and document reason for call. If symptomatic, describe symptoms. ---Caller states he has swelling behind knee and it is red and warm. Symptoms started a couple weeks ago but it is worsening last night and this  morning. Does the patient have any new or worsening symptoms? ---Yes Will a triage be completed? ---Yes Related visit to physician within the last 2 weeks? ---No Does the PT have any chronic conditions? (i.e. diabetes, asthma, this includes High risk factors for pregnancy, etc.) ---Yes List chronic conditions. ---hypertension Is this a behavioral health or substance abuse call? ---No Guidelines Guideline Title Affirmed Question Affirmed Notes Nurse Date/Time Eilene Ghazi Time) Leg Swelling and Edema [1] Thigh, calf, or ankle swelling AND [2] only 1 side Jimmye Norman, RN, [place] 03/29/2021 8:51:24 AM Disp. Time Eilene Ghazi Time) Disposition Final User 03/29/2021 8:57:49 AM Go to ED Now (or PCP triage) Yes Jimmye Norman, RN, Whitney Disposition Overriden: See HCP within 4 Hours (or PCP triage) PLEASE NOTE: All timestamps contained within this report are represented as [place] Standard Time. CONFIDENTIALTY NOTICE: This fax transmission is intended only for the addressee. It contains information that is legally privileged, confidential or otherwise protected from use or disclosure. If you are not the intended recipient, you are strictly prohibited from reviewing, disclosing, copying using or disseminating any of this information or taking any action in reliance on or regarding this information. If you have received this fax in error, please notify us immediately by telephone so that we can arrange for its return to [place]. Phone: [phone], Toll-Free: [phone], Fax: [phone] Page: 2 of 2 Call Id: [id number] Override Reason: Patient's symptoms need a higher level of care Caller Disagree/Comply Comply Caller Understands Yes PreDisposition InappropriateToAsk Care Advice Given Per Guideline GO TO ED NOW (OR PCP TRIAGE): * IF NO PCP (PRIMARY CARE PROVIDER) SECOND-LEVEL TRIAGE: You need to be seen within the next hour. Go to the [facility] at _____________ [place] as soon as you  can. Comments User: Myriam Forehand, RN Date/Time Eilene Ghazi  Time): 03/29/2021 8:58:36 AM RN upgraded triage outcome due to how long symptoms have been present and significantly worsening over the last 24 hours. Referrals GO TO FACILITY OTHER - SPECIFY

## 2021-03-29 NOTE — Telephone Encounter (Signed)
I will await the ER evaluation. His symptoms certainly are concerning for a DVT

## 2021-03-30 ENCOUNTER — Telehealth: Payer: Self-pay

## 2021-03-30 NOTE — Telephone Encounter (Signed)
Spoke to pt about recent ER visit for leg pain. Needs ov with Dr Silvio Pate. I have scheduled that.

## 2021-03-30 NOTE — ED Provider Notes (Signed)
[facility]ER[facility] DEPT Provider Note   CSN: [id number] Arrival date & time: 03/29/21  1035     History Chief Complaint  Patient presents with   Leg Pain    Alex Mueller is a 60 y.o. male.  HPI     60yo male with history below presents with concern for pain to the right calf.  Reports it started 2 weeks ago. Is worse with walking on it, bending it, standing.  It is in the back of his knee and runs down towards the ankle. 8/10 in severity.  No chest pain, dyspnea, hx of dvt/pe, no long trips, recent surgeries or immobilization.  No fevers or chills, numbness or weakness. No injureis or overuse. Retired Development worker, community.   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   Essential hypertension 03/12/2007   Hx of adenomatous colonic polyps 12/03/2004    Past Surgical History:  Procedure Laterality Date   COLONOSCOPY     FOOT SURGERY  07/05/2014   KNEE ARTHROSCOPY     right knee 1988, left knee [place] SURGERY  10/2014       Family History  Problem Relation Age of Onset   Atrial fibrillation Mother    Coronary artery disease Father    Cancer Father        bladder and colon cancer   Diabetes Father    Colon cancer Father    Coronary artery disease Paternal Uncle    Drug abuse Maternal Grandmother    Hypertension Neg Hx    Esophageal cancer Neg Hx    Rectal cancer Neg Hx    Stomach cancer Neg Hx     Social History   Tobacco Use   Smoking status: Never   Smokeless tobacco: Never  Substance  Use Topics   Alcohol use: No    Alcohol/week: 0.0 standard drinks   Drug use: No    Home Medications Prior to Admission medications   Medication Sig Start Date End Date Taking? Authorizing Provider  Ascorbic Acid (VITAMIN C) 1000 MG tablet Take 1,000 mg by mouth daily.   Yes [provider]  aspirin EC 81 MG tablet Take 81 mg by mouth daily. Swallow whole.   Yes [provider]  Cholecalciferol (VITAMIN D-3) 125 MCG (5000 UT) TABS Take 1 tablet by mouth daily.   Yes [provider]  losartan-hydrochlorothiazide (HYZAAR) 100-25 MG tablet Take 1 tablet by mouth daily. 01/12/21  Yes Viviana Simpler I, MD  sildenafil (REVATIO) 20 MG tablet TAKE 3-5 TABLETS BY MOUTH DAILY AS NEEDED 05/26/20  Yes Venia Carbon, MD  Zinc 50 MG TABS Take 1 tablet by mouth daily.   Yes [provider]  EPINEPHRINE 0.3 mg/0.3 mL IJ SOAJ injection INJECT 0.3 MLS (0.3 MG TOTAL) INTO THE MUSCLE ONCE. 11/02/18   Venia Carbon, MD    Allergies    Bee venom, Lisinopril, and Oxycodone  Review of Systems   Review of Systems  Constitutional:  Negative for fever.  Respiratory:  Negative for shortness of breath.   Cardiovascular:  Negative for chest pain.  Gastrointestinal:  Negative for vomiting.  Musculoskeletal:  Positive for arthralgias and myalgias.  Skin:  Negative for rash.  Neurological:  Negative for light-headedness.   Physical Exam Updated Vital Signs BP (!) 143/86   Pulse 64   Temp 98.5 F (36.9 C)   Resp 18   Ht '6\' 5"'$  (1.956 m)   Wt 108.9 kg   SpO2 98%   BMI 28.46 kg/m   Physical Exam Vitals and nursing note reviewed.  Constitutional:      General: He is not in acute distress.    Appearance: Normal appearance. He is not ill-appearing, toxic-appearing or diaphoretic.  HENT:     Head: Normocephalic.  Eyes:     Conjunctiva/sclera: Conjunctivae normal.  Cardiovascular:     Rate and Rhythm: Normal rate and regular rhythm.     Pulses: Normal pulses.   Pulmonary:     Effort: Pulmonary effort is normal. No respiratory distress.  Musculoskeletal:        General: Tenderness (right calf, popliteal fossa, prominent varicose veins) present. No deformity or signs of injury.     Cervical back: No rigidity.  Skin:    General: Skin is warm and dry.     Coloration: Skin is not jaundiced or pale.  Neurological:     General: No focal deficit present.     Mental Status: He is alert and oriented to person, place, and time.    ED Results / Procedures / Treatments   Labs (all labs ordered are listed, but only abnormal results are displayed) Labs Reviewed - No data to display  EKG None  Radiology US Venous Img Lower Unilateral Right  Result Date: 03/29/2021 CLINICAL DATA:  Right lower extremity pain for the past 2 weeks. Evaluate for DVT. EXAM: RIGHT LOWER EXTREMITY VENOUS DOPPLER ULTRASOUND TECHNIQUE: Gray-scale sonography with graded compression, as well as color Doppler and duplex ultrasound were performed to evaluate the lower extremity deep venous systems from the level of the common femoral vein and including the common femoral, femoral, profunda femoral, popliteal and calf veins including the posterior tibial, peroneal and gastrocnemius veins when visible. The superficial great saphenous vein was also interrogated. Spectral Doppler was utilized to evaluate flow at rest and with distal augmentation maneuvers in the common femoral, femoral and popliteal veins. COMPARISON:  None. FINDINGS: Contralateral Common Femoral Vein: Respiratory phasicity is normal and symmetric with the symptomatic side. No evidence of thrombus. Normal compressibility. Common Femoral Vein: No evidence of thrombus. Normal compressibility, respiratory phasicity and response to augmentation. Saphenofemoral Junction: No evidence of thrombus. Normal compressibility and flow on color Doppler imaging. Profunda Femoral Vein: No evidence of thrombus. Normal compressibility and flow on  color Doppler imaging. Femoral Vein: No evidence of thrombus. Normal compressibility, respiratory phasicity and response to augmentation. Popliteal Vein: No evidence of thrombus. Normal compressibility, respiratory phasicity and response to augmentation. Calf Veins: No evidence of thrombus. Normal compressibility and flow on color Doppler imaging. Superficial Great Saphenous Vein: No evidence of thrombus. Normal compressibility. Venous Reflux:  None. Other Findings: Note is made of several prominent though widely patent superficial varicosities at the level of the popliteal fossa (images 23 and 24). IMPRESSION: 1. No evidence of DVT or SVT within the right lower extremity. 2. Note is made of several prominent though widely patent superficial varicosities at the level of the right popliteal fossa. Electronically Signed   By: Eldridge Abrahams.D.  On: 03/29/2021 13:59    Procedures Procedures   Medications Ordered in ED Medications - No data to display  ED Course  I have reviewed the triage vital signs and the nursing notes.  Pertinent labs & imaging results that were available during my care of the patient were reviewed by me and considered in my medical decision making (see chart for details).    MDM Rules/Calculators/A&P                            60yo male with history below presents with concern for pain to the right calf. Normal pulses, no sign of acute arterial thrombus. No sign of cellulitis or abscess, Exam does not seem consistent with baker's cyst, and [place] does not comment on this.  DVT study shows no DVT< prominent varicose veins. Recommend outpt follow up.   Final Clinical Impression(s) / ED Diagnoses Final diagnoses:  Symptomatic varicose veins, right    Rx / DC Orders ED Discharge Orders     None        Gareth Morgan, MD 03/30/21 1108

## 2021-04-11 ENCOUNTER — Other Ambulatory Visit: Payer: Self-pay

## 2021-04-11 ENCOUNTER — Ambulatory Visit: Payer: BC Managed Care – PPO | Admitting: Internal Medicine

## 2021-04-11 ENCOUNTER — Encounter: Payer: Self-pay | Admitting: Internal Medicine

## 2021-04-11 DIAGNOSIS — M79604 Pain in right leg: Secondary | ICD-10-CM | POA: Diagnosis not present

## 2021-04-11 NOTE — Patient Instructions (Signed)
Please start with an elastic compression brace for your right knee. Wear it whenever you are working or on your feet for extended times. If that doesn't help the pain, you will need to try thigh high support hose (like from a medical supply store)

## 2021-04-11 NOTE — Progress Notes (Signed)
Subjective:    Patient ID: Alex Mueller, male    DOB: 12-31-1960, 60 y.o.   MRN: [id number]  HPI Here due to ongoing right leg pain This visit occurred during the SARS-CoV-2 public health emergency.  Safety protocols were in place, including screening questions prior to the visit, additional usage of staff PPE, and extensive cleaning of exam room while observing appropriate contact time as indicated for disinfecting solutions.   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     aspirin EC 81 MG tablet Take 81 mg by mouth daily. Swallow whole.     Cholecalciferol (VITAMIN D-3) 125 MCG (5000 UT) TABS Take 1 tablet by mouth daily.     EPINEPHRINE 0.3 mg/0.3 mL IJ SOAJ injection INJECT 0.3 MLS (0.3 MG TOTAL) INTO THE MUSCLE ONCE. 2 Device 1   losartan-hydrochlorothiazide (HYZAAR) 100-25 MG tablet Take 1 tablet by mouth daily. 90 tablet 3   sildenafil (REVATIO) 20 MG tablet TAKE 3-5 TABLETS BY MOUTH DAILY AS NEEDED 50 tablet 11   Zinc 50 MG TABS Take 1 tablet by mouth daily.     No current facility-administered medications on file prior to visit.    Allergies  Allergen Reactions   Bee Venom Shortness Of Breath   Lisinopril     REACTION: cough   Oxycodone Nausea And Vomiting    Past Medical History:  Diagnosis Date   Angiodysplasia of cecum 08/23/2020   ED (erectile dysfunction)    Hx of adenomatous colonic polyps 12/03/2004   Hyperlipidemia    Hypertension     Past Surgical History:  Procedure Laterality Date   COLONOSCOPY     FOOT SURGERY   07/05/2014   KNEE ARTHROSCOPY     right knee 1988, left knee [place] SURGERY  10/2014    Family History  Problem Relation Age of Onset   Atrial fibrillation Mother    Coronary artery disease Father    Cancer Father        bladder and colon cancer   Diabetes Father    Colon cancer Father    Coronary artery disease Paternal Uncle    Drug abuse Maternal Grandmother    Hypertension Neg Hx    Esophageal cancer Neg Hx    Rectal cancer Neg Hx    Stomach cancer Neg Hx     Social History   Socioeconomic History   Marital status: Married    Spouse name: Not on file   Number of children: 2   Years of education: Not on file   Highest education level: Not on file  Occupational History   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  Tobacco Use   Smoking status: Never   Smokeless tobacco: Never  Scientific laboratory technician Use: Not on file  Substance and Sexual Activity   Alcohol use: No    Alcohol/week: 0.0 standard drinks   Drug use: No   Sexual activity: Yes  Other Topics Concern   Not  on file  Social History Narrative   Retired [place] Strain: Not on [employer] Insecurity: Not on file  Transportation Needs: Not on file  Physical Activity: Not on file  Stress: Not on file  Social Connections: Not on file  Intimate Partner Violence: Not on file   Review of Systems No leg injuries Both knees have had arthroscopy     Objective:   Physical Exam Constitutional:      Appearance: Normal appearance.  Cardiovascular:     Pulses: Normal pulses.  Musculoskeletal:     Comments: Scattered varicosities on right >left calf and thigh. None tender Puffiness behind right knee---not clear if Baker's cyst  Neurological:     Mental Status: He is alert.           Assessment & Plan:

## 2021-04-11 NOTE — Assessment & Plan Note (Signed)
In popliteal fossa mostly Has distention that could be Baker's cyst--though not noted on the ultrasound Discussed trying compression knee brace first for while working or up all day--if not effective,will need thigh high support hose May need to see vascular surgeon eventually

## 2021-10-03 ENCOUNTER — Telehealth: Payer: Self-pay | Admitting: Internal Medicine

## 2021-10-03 MED ORDER — EPINEPHRINE 0.3 MG/0.3ML IJ SOAJ: INTRAMUSCULAR | 1 refills | Status: DC

## 2021-10-03 NOTE — Telephone Encounter (Signed)
?  Encourage patient to contact the pharmacy for refills or they can request refills through [facility] ? ?LAST APPOINTMENT DATE:  Please schedule appointment if longer than 1 year ? ?NEXT APPOINTMENT DATE: ? ?MEDICATION:EPINEPHRINE 0.3 mg/0.3 mL IJ SOAJ injection ? ?Is the patient out of medication?  ? ?PHARMACY:[facility] [id number] - [place], [place] ? ?Let patient know to contact pharmacy at the end of the day to make sure medication is ready. ? ?Please notify patient to allow 48-72 hours to process ? ?CLINICAL FILLS OUT ALL BELOW:  ? ?LAST REFILL: ? ?QTY: ? ?REFILL DATE: ? ? ? ?OTHER COMMENTS:  ? ? ?Okay for refill? ? ?Please advise ? ? ?  ?

## 2021-12-07 ENCOUNTER — Ambulatory Visit: Payer: BC Managed Care – PPO | Admitting: Family Medicine

## 2021-12-07 ENCOUNTER — Encounter: Payer: Self-pay | Admitting: Family Medicine

## 2021-12-07 VITALS — BP 118/80 | HR 65 | Temp 97.8°F | Ht 76.0 in | Wt 251.1 lb

## 2021-12-07 DIAGNOSIS — T63441A Toxic effect of venom of bees, accidental (unintentional), initial encounter: Secondary | ICD-10-CM | POA: Diagnosis not present

## 2021-12-07 MED ORDER — METHYLPREDNISOLONE ACETATE 40 MG/ML IJ SUSP
60.0000 mg | Freq: Once | INTRAMUSCULAR | Status: AC
  Administered 2021-12-07: 60 mg via INTRAMUSCULAR

## 2021-12-07 MED ORDER — PREDNISONE 20 MG PO TABS: ORAL_TABLET | ORAL | 0 refills | Status: DC

## 2021-12-07 NOTE — Assessment & Plan Note (Signed)
Acute, worsening  No associated oral swelling, respiratory distress or sign of anaphylaxis No sign of compression syndrome despite significant swelling in right forearm.  He was treated with Depo 60 mg IM x1.  If he has minimal improvement in the next 24 hours with this he can start a prednisone taper. He will continue Benadryl at night and Claritin during the day for antihistamine coverage.  Red flags were reviewed with the patient for ER precautions.  We reviewed EpiPen use and when to use it.

## 2021-12-07 NOTE — Progress Notes (Signed)
Patient ID: Alex Mueller, male    DOB: Jan 30, 1961, 61 y.o.   MRN: [id number]  This visit was conducted in person.  BP 118/80   Pulse 65   Temp 97.8 F (36.6 C) (Oral)   Ht '6\' 4"'$  (1.93 m)   Wt 251 lb 2 oz (113.9 kg)   SpO2 97%   BMI 30.57 kg/m    CC:  Chief Complaint  Patient presents with   Insect Bite    Bee Sting on right thumb yesterday-Hand Swelling and pain radiating up to elbow    Subjective:   HPI: Alex Mueller is a 61 y.o. male presenting on 12/07/2021 for Insect Bite (Bee Sting on right thumb yesterday-Hand Swelling and pain radiating up to elbow)  New onset exposure to bee sting on  base right thumb. Occurred yesterday. Has had increasing swelling in right thumb, to hand.. worsened to right elbow. Soreness, no numbness.  Took benadryl.  Did not use epi-pen.   No oral swelling, no SOB.  Hx of allergic reaction to bee sting.      Relevant past medical, surgical, family and social history reviewed and updated as indicated. Interim medical history since our last visit reviewed. Allergies and medications reviewed and updated. Outpatient Medications Prior to Visit  Medication Sig Dispense Refill   Ascorbic Acid (VITAMIN C) 1000 MG tablet Take 1,000 mg by mouth daily.     aspirin EC 81 MG tablet Take 81 mg by mouth daily. Swallow whole.     Cholecalciferol (VITAMIN D-3) 125 MCG (5000 UT) TABS Take 1 tablet by mouth daily.     EPINEPHrine 0.3 mg/0.3 mL IJ SOAJ injection INJECT 0.3 MLS (0.3 MG TOTAL) INTO THE MUSCLE ONCE. 2 each 1   losartan-hydrochlorothiazide (HYZAAR) 100-25 MG tablet Take 1 tablet by mouth daily. 90 tablet 3   sildenafil (REVATIO) 20 MG tablet TAKE 3-5 TABLETS BY MOUTH DAILY AS NEEDED 50 tablet 11   Zinc 50 MG TABS Take 1 tablet by mouth daily.     No facility-administered medications prior to visit.     Per HPI unless specifically indicated in ROS section below Review of Systems  Constitutional:  Negative for fatigue and fever.  HENT:   Negative for ear pain.   Eyes:  Negative for pain.  Respiratory:  Negative for cough and shortness of breath.   Cardiovascular:  Negative for chest pain, palpitations and leg swelling.  Gastrointestinal:  Negative for abdominal pain.  Genitourinary:  Negative for dysuria.  Musculoskeletal:  Negative for arthralgias.  Neurological:  Negative for syncope, light-headedness and headaches.  Psychiatric/Behavioral:  Negative for dysphoric mood.   Objective:  BP 118/80   Pulse 65   Temp 97.8 F (36.6 C) (Oral)   Ht '6\' 4"'$  (1.93 m)   Wt 251 lb 2 oz (113.9 kg)   SpO2 97%   BMI 30.57 kg/m   Wt Readings from Last 3 Encounters:  12/07/21 251 lb 2 oz (113.9 kg)  04/11/21 244 lb (110.7 kg)  03/29/21 240 lb (108.9 kg)      Physical Exam Constitutional:      Appearance: He is well-developed.  HENT:     Head: Normocephalic.     Right Ear: Hearing normal.     Left Ear: Hearing normal.     Nose: Nose normal.  Neck:     Thyroid: No thyroid mass or thyromegaly.     Vascular: No carotid bruit.     Trachea: Trachea normal.  Cardiovascular:  Rate and Rhythm: Normal rate and regular rhythm.     Pulses: Normal pulses.     Heart sounds: Heart sounds not distant. No murmur heard.   No friction rub. No gallop.     Comments: No peripheral edema Pulmonary:     Effort: Pulmonary effort is normal. No respiratory distress.     Breath sounds: Normal breath sounds.  Skin:    General: Skin is warm and dry.     Findings: No rash.     Comments: Right thumb with puncture.. right hand and forearm with swelling and slight erythema  NO decrease in sensation, normal pulses  Psychiatric:        Speech: Speech normal.        Behavior: Behavior normal.        Thought Content: Thought content normal.      Results for orders placed or performed in visit on 01/12/21  Comprehensive metabolic panel  Result Value Ref Range   Sodium 138 135 - 145 mEq/L   Potassium 4.1 3.5 - 5.1 mEq/L   Chloride 101 96 -  112 mEq/L   CO2 29 19 - 32 mEq/L   Glucose, Bld 76 70 - 99 mg/dL   BUN 14 6 - 23 mg/dL   Creatinine, Ser 0.99 0.40 - 1.50 mg/dL   Total Bilirubin 0.8 0.2 - 1.2 mg/dL   Alkaline Phosphatase 65 39 - 117 U/L   AST 16 0 - 37 U/L   ALT 17 0 - 53 U/L   Total Protein 6.9 6.0 - 8.3 g/dL   Albumin 4.3 3.5 - 5.2 g/dL   GFR 82.37 >60.00 mL/min   Calcium 9.4 8.4 - 10.5 mg/dL  Lipid panel  Result Value Ref Range   Cholesterol 173 0 - 200 mg/dL   Triglycerides 120.0 0.0 - 149.0 mg/dL   HDL 41.40 >39.00 mg/dL   VLDL 24.0 0.0 - 40.0 mg/dL   LDL Cholesterol 108 (H) 0 - 99 mg/dL   Total CHOL/HDL Ratio 4    NonHDL 131.92   CBC  Result Value Ref Range   WBC 8.1 4.0 - 10.5 K/uL   RBC 5.24 4.22 - 5.81 Mil/uL   Platelets 264.0 150.0 - 400.0 K/uL   Hemoglobin 15.5 13.0 - 17.0 g/dL   HCT 45.2 39.0 - 52.0 %   MCV 86.2 78.0 - 100.0 fl   MCHC 34.2 30.0 - 36.0 g/dL   RDW 13.9 11.5 - 15.5 %  PSA  Result Value Ref Range   PSA 2.15 0.10 - 4.00 ng/mL     COVID 19 screen:  No recent travel or known exposure to COVID19 The patient denies respiratory symptoms of COVID 19 at this time. The importance of social distancing was discussed today.   Assessment and Plan Problem List Items Addressed This Visit     Bee sting reaction - Primary    Acute, worsening  No associated oral swelling, respiratory distress or sign of anaphylaxis No sign of compression syndrome despite significant swelling in right forearm.  He was treated with Depo 60 mg IM x1.  If he has minimal improvement in the next 24 hours with this he can start a prednisone taper. He will continue Benadryl at night and Claritin during the day for antihistamine coverage.  Red flags were reviewed with the patient for ER precautions.  We reviewed EpiPen use and when to use it.       Meds ordered this encounter  Medications   predniSONE (DELTASONE) 20 MG  tablet    Sig: 3 tabs by mouth daily x 3 days, then 2 tabs by mouth daily x 2 days then 1  tab by mouth daily x 2 days    Dispense:  15 tablet    Refill:  0   methylPREDNISolone acetate (DEPO-MEDROL) injection 60 mg         Eliezer Lofts, MD

## 2022-01-16 ENCOUNTER — Encounter: Payer: Self-pay | Admitting: Internal Medicine

## 2022-01-16 ENCOUNTER — Ambulatory Visit (INDEPENDENT_AMBULATORY_CARE_PROVIDER_SITE_OTHER): Payer: BC Managed Care – PPO | Admitting: Internal Medicine

## 2022-01-16 VITALS — BP 120/84 | HR 64 | Temp 97.6°F | Ht 76.0 in | Wt 244.0 lb

## 2022-01-16 DIAGNOSIS — I10 Essential (primary) hypertension: Secondary | ICD-10-CM

## 2022-01-16 DIAGNOSIS — Z0000 Encounter for general adult medical examination without abnormal findings: Secondary | ICD-10-CM | POA: Diagnosis not present

## 2022-01-16 LAB — CBC
HCT: 47.7 % (ref 39.0–52.0)
Hemoglobin: 15.9 g/dL (ref 13.0–17.0)
MCHC: 33.3 g/dL (ref 30.0–36.0)
MCV: 89.1 fl (ref 78.0–100.0)
Platelets: 263 10*3/uL (ref 150.0–400.0)
RBC: 5.35 Mil/uL (ref 4.22–5.81)
RDW: 13.8 % (ref 11.5–15.5)
WBC: 7.8 10*3/uL (ref 4.0–10.5)

## 2022-01-16 LAB — COMPREHENSIVE METABOLIC PANEL
ALT: 19 U/L (ref 0–53)
AST: 16 U/L (ref 0–37)
Albumin: 4.5 g/dL (ref 3.5–5.2)
Alkaline Phosphatase: 66 U/L (ref 39–117)
BUN: 13 mg/dL (ref 6–23)
CO2: 29 mEq/L (ref 19–32)
Calcium: 9.6 mg/dL (ref 8.4–10.5)
Chloride: 99 mEq/L (ref 96–112)
Creatinine, Ser: 1.03 mg/dL (ref 0.40–1.50)
GFR: 77.99 mL/min (ref >60.00)
Glucose, Bld: 83 mg/dL (ref 70–99)
Potassium: 4.1 mEq/L (ref 3.5–5.1)
Sodium: 136 mEq/L (ref 135–145)
Total Bilirubin: 0.8 mg/dL (ref 0.2–1.2)
Total Protein: 7.2 g/dL (ref 6.0–8.3)

## 2022-01-16 MED ORDER — TADALAFIL 20 MG PO TABS: 10.0000 mg | ORAL_TABLET | ORAL | 11 refills | Status: DC | PRN

## 2022-01-16 MED ORDER — LOSARTAN POTASSIUM-HCTZ 100-25 MG PO TABS: 1.0000 | ORAL_TABLET | Freq: Every day | ORAL | 3 refills | Status: DC

## 2022-01-16 NOTE — Assessment & Plan Note (Signed)
BP Readings from Last 3 Encounters:  01/16/22 120/84  12/07/21 118/80  04/11/21 124/86   Good control on losartan/HCTZ 100/25 Will check labs  Discussed ASA for primary prevention--will change to every other day

## 2022-01-16 NOTE — Progress Notes (Signed)
Subjective:    Patient ID: Alex Mueller, male    DOB: 1960-11-05, 61 y.o.   MRN: [id number]  HPI Here for physical  Doing well Going back to work part time now He walks some also  Current Outpatient Medications on File Prior to Visit  Medication Sig Dispense Refill   Ascorbic Acid (VITAMIN C) 1000 MG tablet Take 1,000 mg by mouth daily.     aspirin EC 81 MG tablet Take 81 mg by mouth daily. Swallow whole.     Cholecalciferol (VITAMIN D-3) 125 MCG (5000 UT) TABS Take 1 tablet by mouth daily.     EPINEPHrine 0.3 mg/0.3 mL IJ SOAJ injection INJECT 0.3 MLS (0.3 MG TOTAL) INTO THE MUSCLE ONCE. 2 each 1   losartan-hydrochlorothiazide (HYZAAR) 100-25 MG tablet Take 1 tablet by mouth daily. 90 tablet 3   sildenafil (REVATIO) 20 MG tablet TAKE 3-5 TABLETS BY MOUTH DAILY AS NEEDED 50 tablet 11   Zinc 50 MG TABS Take 1 tablet by mouth daily.     No current facility-administered medications on file prior to visit.    Allergies  Allergen Reactions   Bee Venom Shortness Of Breath   Lisinopril     REACTION: cough   Oxycodone Nausea And Vomiting    Past Medical History:  Diagnosis Date   Angiodysplasia of cecum 08/23/2020   ED (erectile dysfunction)    Hx of adenomatous colonic polyps 12/03/2004   Hyperlipidemia    Hypertension     Past Surgical History:  Procedure Laterality Date   COLONOSCOPY     FOOT SURGERY  07/05/2014   KNEE ARTHROSCOPY     right knee 1988, left knee [place] SURGERY  10/2014    Family History  Problem Relation Age of Onset   Atrial fibrillation Mother    Coronary artery disease Father    Cancer Father        bladder and colon cancer   Diabetes Father    Colon cancer Father    Coronary artery disease Paternal Uncle    Drug abuse Maternal Grandmother    Hypertension Neg Hx    Esophageal cancer Neg Hx    Rectal cancer Neg Hx    Stomach cancer Neg Hx     Social History   Socioeconomic History   Marital status: Married    Spouse name:  Not on file   Number of children: 2   Years of education: Not on file   Highest education level: Not on file  Occupational History   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  Tobacco Use   Smoking status: Never    Passive exposure: Never   Smokeless tobacco: Never  Vaping Use   Vaping Use: Not on file  Substance and Sexual Activity   Alcohol use: No    Alcohol/week: 0.0 standard drinks of alcohol   Drug use: No   Sexual activity: Yes  Other Topics Concern   Not on file  Social History Narrative   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  Wears seat belt  HENT:  Negative for dental problem, hearing loss and tinnitus.   Eyes:  Negative for visual disturbance.       No diplopia or unilateral vision loss  Respiratory:  Negative for cough, chest tightness and shortness of breath.   Cardiovascular:  Negative for chest pain, palpitations and leg swelling.  Gastrointestinal:  Negative for blood in stool and constipation.       No heartburn  Endocrine: Negative for polydipsia and polyuria.  Genitourinary:  Negative for difficulty urinating and urgency.       Not that happy with sildenafil  Musculoskeletal:  Negative for back pain and joint swelling.       AM joint pains--but loosens up. Occasionally takes 3 advil  Skin:  Negative for rash.       Gets yearly body check  Allergic/Immunologic: Negative for environmental allergies and immunocompromised state.  Neurological:  Negative for dizziness, syncope, light-headedness and headaches.   Hematological:  Negative for adenopathy. Does not bruise/bleed easily.  Psychiatric/Behavioral:  Negative for dysphoric mood and sleep disturbance. The patient is not nervous/anxious.        Objective:   Physical Exam Constitutional:      Appearance: Normal appearance.  HENT:     Mouth/Throat:     Pharynx: No oropharyngeal exudate or posterior oropharyngeal erythema.  Eyes:     Conjunctiva/sclera: Conjunctivae normal.     Pupils: Pupils are equal, round, and reactive to light.  Cardiovascular:     Rate and Rhythm: Normal rate and regular rhythm.     Pulses: Normal pulses.     Heart sounds: No murmur heard.    No gallop.  Pulmonary:     Effort: Pulmonary effort is normal.     Breath sounds: Normal breath sounds. No wheezing or rales.  Abdominal:     Palpations: Abdomen is soft.     Tenderness: There is no abdominal tenderness.  Musculoskeletal:     Cervical back: Neck supple.     Right lower leg: No edema.     Left lower leg: No edema.  Lymphadenopathy:     Cervical: No cervical adenopathy.  Skin:    Findings: No lesion or rash.  Neurological:     General: No focal deficit present.     Mental Status: He is alert and oriented to person, place, and time.  Psychiatric:        Mood and Affect: Mood normal.        Behavior: Behavior normal.            Assessment & Plan:

## 2022-01-16 NOTE — Assessment & Plan Note (Signed)
Healthy Discussed fitness Colon due 2027 Will defer PSA to next year Updated COVID vaccine in the fall---recommended flu vaccine also

## 2022-02-08 IMAGING — US US EXTREM LOW VENOUS*R*
1 series · 13 of 24 positions shown · non-contrast
Comparison: None.

CLINICAL DATA: Right lower extremity pain for the past 2 weeks.
Evaluate for DVT.



[Series 1: us venous img lower uni right (dvt) · portal-venous · 39 acquisitions, 13 frames shown]
[im 1/39]
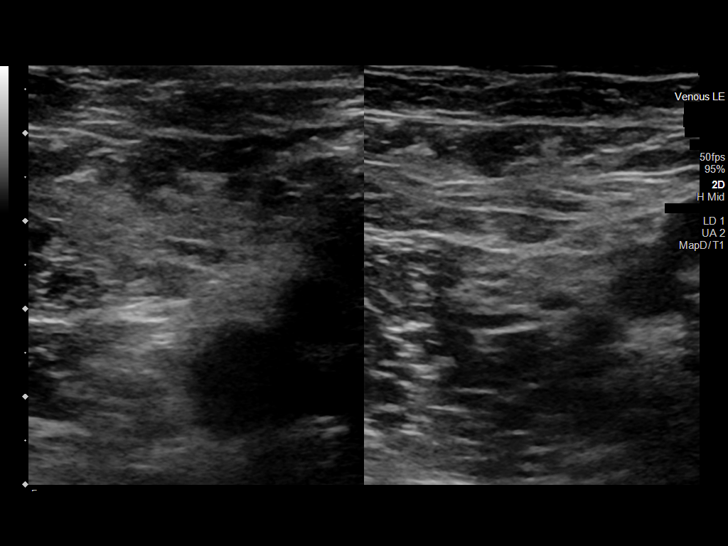
[im 4/39]
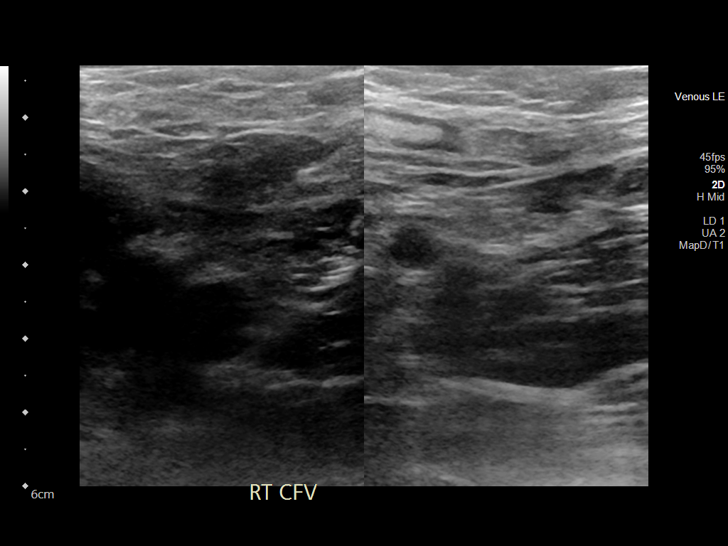
[im 7/39]
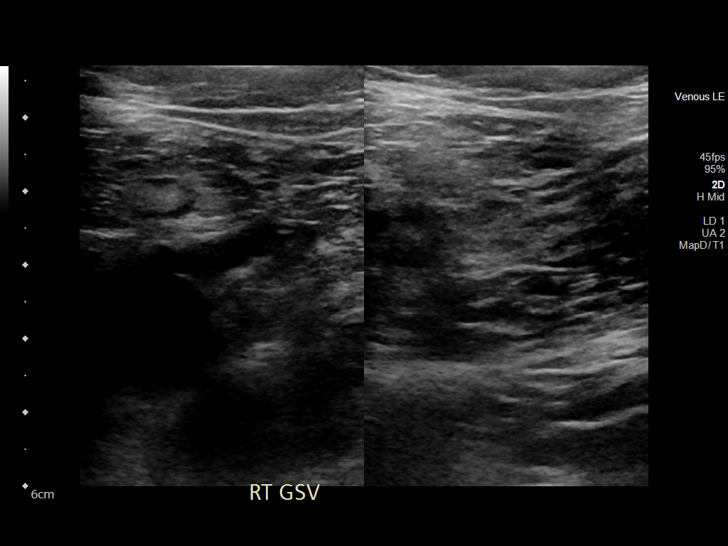
[im 10/39]
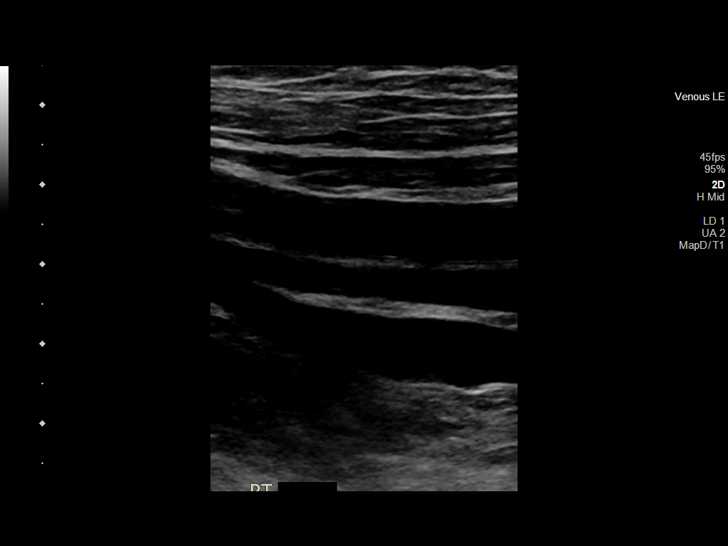
[im 14/39]
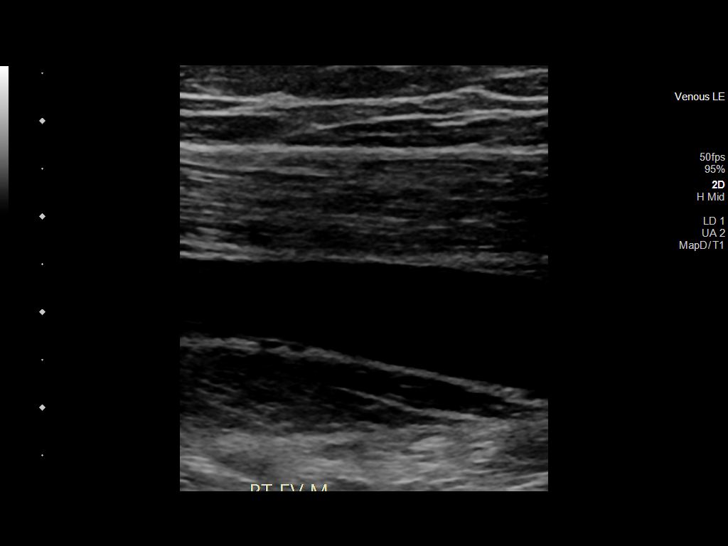
[im 17/39]
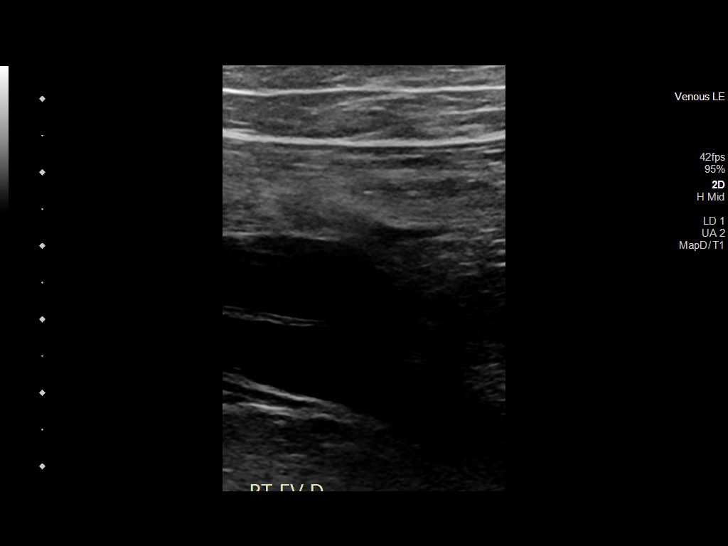
[im 22/39]
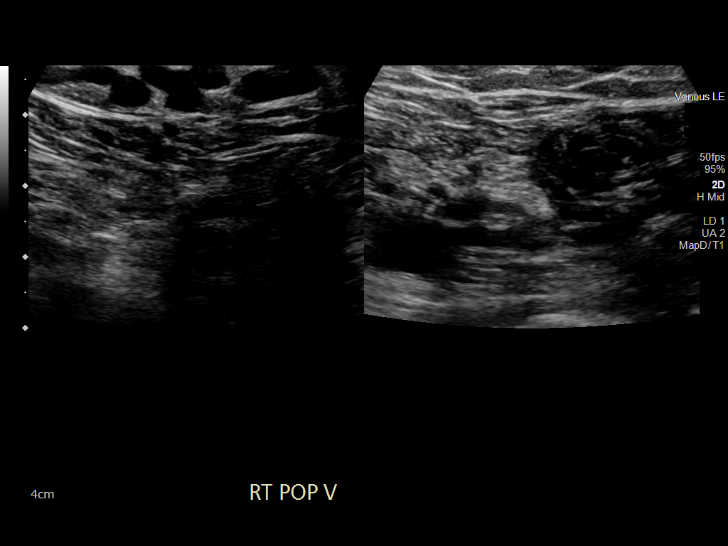
[im 24/39]
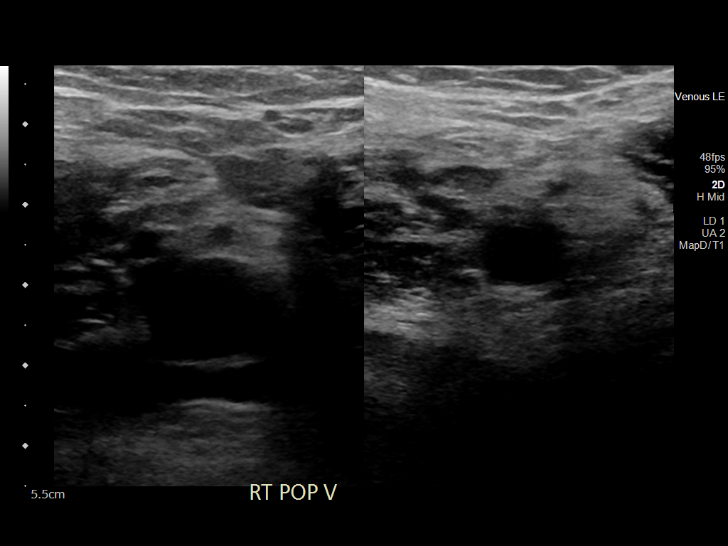
[im 27/39]
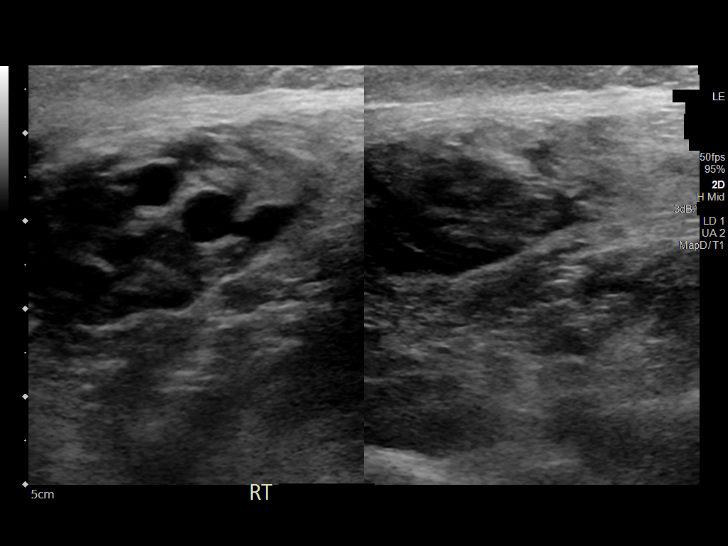
[im 30/39]
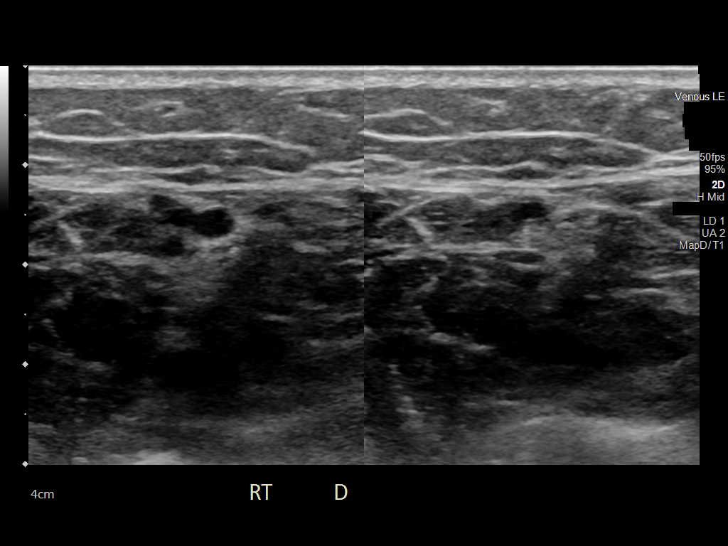
[im 34/39]
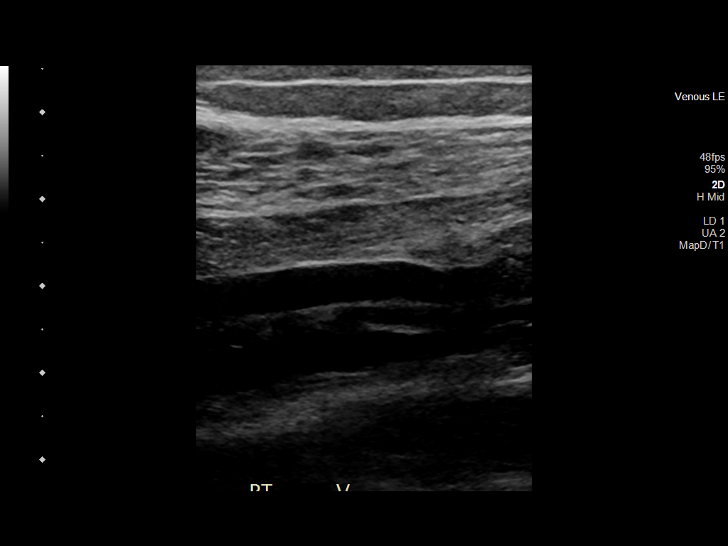
[im 37/39]
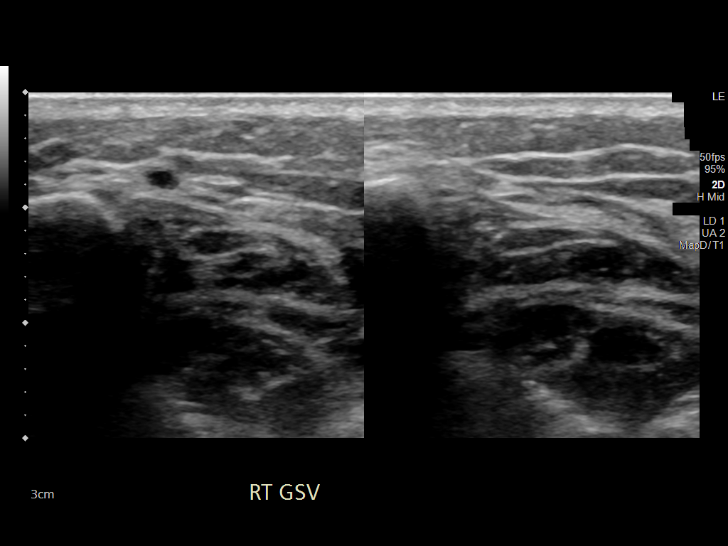
[im 39/39]
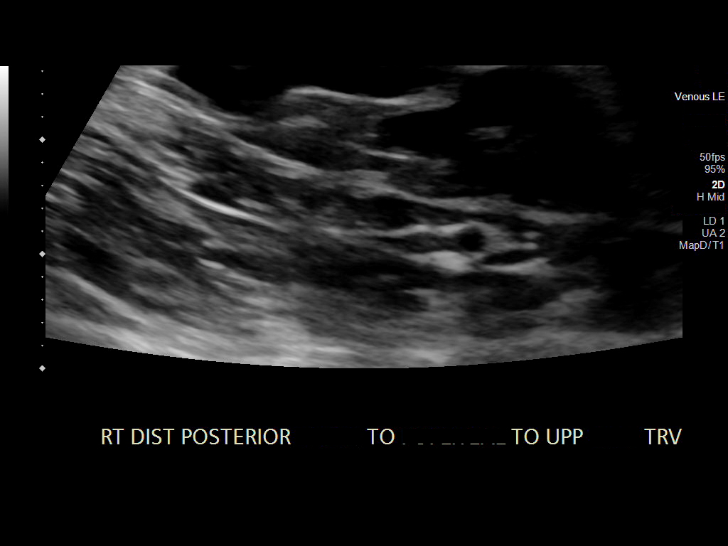

[13 of 24 positions shown; findings below may reference images not displayed]

FINDINGS: Contralateral Common Femoral Vein: Respiratory phasicity is normal
and symmetric with the symptomatic side. No evidence of thrombus.
Normal compressibility.

Common Femoral Vein: No evidence of thrombus. Normal
compressibility, respiratory phasicity and response to augmentation.

Saphenofemoral Junction: No evidence of thrombus. Normal
compressibility and flow on color Doppler imaging.

Profunda Femoral Vein: No evidence of thrombus. Normal
compressibility and flow on color Doppler imaging.

Femoral Vein: No evidence of thrombus. Normal compressibility,
respiratory phasicity and response to augmentation.

Popliteal Vein: No evidence of thrombus. Normal compressibility,
respiratory phasicity and response to augmentation.

Calf Veins: No evidence of thrombus. Normal compressibility and flow
on color Doppler imaging.

Superficial Great Saphenous Vein: No evidence of thrombus. Normal
compressibility.

Venous Reflux:  None.

Other Findings: Note is made of several prominent though widely
patent superficial varicosities at the level of the popliteal fossa
(images 23 and 24).
IMPRESSION: 1. No evidence of DVT or SVT within the right lower extremity.
2. Note is made of several prominent though widely patent
superficial varicosities at the level of the right popliteal fossa.

## 2022-04-29 ENCOUNTER — Other Ambulatory Visit: Payer: Self-pay | Admitting: Internal Medicine

## 2022-08-21 ENCOUNTER — Telehealth: Payer: Self-pay | Admitting: Internal Medicine

## 2022-08-21 DIAGNOSIS — H538 Other visual disturbances: Secondary | ICD-10-CM

## 2022-08-21 NOTE — Telephone Encounter (Signed)
Spoke to pt. 

## 2022-08-21 NOTE — Telephone Encounter (Signed)
Patient called in and stated that he is having some blurred vision. He went to an eye doctor but they are needing a referral in order to see him. He stated that  the referral can be sent to Dr. Katy Apo. Patient would like a call when the referral is put in. Please advise. Thank you!

## 2022-08-21 NOTE — Telephone Encounter (Signed)
Let him know I sent in the referral and he should hear about this in the near future

## 2022-08-21 NOTE — Addendum Note (Signed)
Addended by: Viviana Simpler I on: 08/21/2022 10:25 AM   Modules accepted: Orders

## 2023-01-20 ENCOUNTER — Ambulatory Visit (INDEPENDENT_AMBULATORY_CARE_PROVIDER_SITE_OTHER): Payer: BC Managed Care – PPO | Admitting: Internal Medicine

## 2023-01-20 ENCOUNTER — Encounter: Payer: Self-pay | Admitting: Internal Medicine

## 2023-01-20 VITALS — BP 122/84 | HR 65 | Temp 97.7°F | Ht 75.75 in | Wt 246.0 lb

## 2023-01-20 DIAGNOSIS — I10 Essential (primary) hypertension: Secondary | ICD-10-CM | POA: Diagnosis not present

## 2023-01-20 DIAGNOSIS — Z125 Encounter for screening for malignant neoplasm of prostate: Secondary | ICD-10-CM

## 2023-01-20 DIAGNOSIS — Z0000 Encounter for general adult medical examination without abnormal findings: Secondary | ICD-10-CM

## 2023-01-20 MED ORDER — EPINEPHRINE 0.3 MG/0.3ML IJ SOAJ: INTRAMUSCULAR | 1 refills | Status: DC

## 2023-01-20 NOTE — Progress Notes (Signed)
Subjective:    Patient ID: Alex Mueller, male    DOB: May 10, 1961, 62 y.o.   MRN: [id number]  HPI Here for physical  Doing well Still working part time--contracting Does exercise when not working  Continues on BP med Doesn't check at home  Current Outpatient Medications on File Prior to Visit  Medication Sig Dispense Refill   EPINEPHrine 0.3 mg/0.3 mL IJ SOAJ injection INJECT 0.3 MLS (0.3 MG TOTAL) INTO THE MUSCLE ONCE. 2 each 1   losartan-hydrochlorothiazide (HYZAAR) 100-25 MG tablet TAKE 1 TABLET BY MOUTH DAILY 90 tablet 3   tadalafil (CIALIS) 20 MG tablet Take 0.5-1 tablets (10-20 mg total) by mouth every other day as needed for erectile dysfunction. 10 tablet 11   No current facility-administered medications on file prior to visit.    Allergies  Allergen Reactions   Bee Venom Shortness Of Breath   Lisinopril     REACTION: cough   Oxycodone Nausea And Vomiting    Past Medical History:  Diagnosis Date   Angiodysplasia of cecum 08/23/2020   ED (erectile dysfunction)    Hx of adenomatous colonic polyps 12/03/2004   Hyperlipidemia    Hypertension     Past Surgical History:  Procedure Laterality Date   COLONOSCOPY     FOOT SURGERY  07/05/2014   KNEE ARTHROSCOPY     right knee 1988, left knee 1998   LUMBAR DISC SURGERY  10/2014    Family History  Problem Relation Age of Onset   Atrial fibrillation Mother    Coronary artery disease Father    Cancer Father        bladder and colon cancer   Diabetes Father    Colon cancer Father    Coronary artery disease Paternal Uncle    Drug abuse Maternal Grandmother    Hypertension Neg Hx    Esophageal cancer Neg Hx    Rectal cancer Neg Hx    Stomach cancer Neg Hx     Social History   Socioeconomic History   Marital status: Married    Spouse name: Not on file   Number of children: 2   Years of education: Not on file   Highest education level: Not on file  Occupational History   Occupation: Regulatory affairs officer: [employer]    Comment: Retired--now part time as Surveyor, minerals  Tobacco Use   Smoking status: Never    Passive exposure: Never   Smokeless tobacco: Never  Vaping Use   Vaping Use: Not on file  Substance and Sexual Activity   Alcohol use: No    Alcohol/week: 0.0 standard drinks of alcohol   Drug use: No   Sexual activity: Yes  Other Topics Concern   Not on file  Social History Narrative   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       Does tire out easily in the summer--?related to heat Wears seat belt  HENT:  Negative for dental problem, hearing loss and tinnitus.        Keeps up with dentist  Eyes:        Recent eye exam was fine  Mild symptoms---reading glasses only  Respiratory:  Negative for cough, chest tightness and shortness of breath.   Cardiovascular:  Negative for chest pain, palpitations and leg swelling.  Gastrointestinal:  Negative for blood in stool and constipation.       No heartburn  Genitourinary:  Negative for difficulty urinating and urgency.       Uses cialis---slight help  Musculoskeletal:  Negative for arthralgias, back pain and joint swelling.  Skin:  Negative for rash.       Yearly skin exam  Allergic/Immunologic: Negative for environmental allergies and immunocompromised state.  Neurological:  Negative for dizziness, syncope, light-headedness and headaches.  Hematological:  Negative for adenopathy. Does not bruise/bleed easily.  Psychiatric/Behavioral:  Negative for dysphoric mood and sleep disturbance. The patient is not nervous/anxious.        Objective:   Physical  Exam Constitutional:      Appearance: Normal appearance.  HENT:     Mouth/Throat:     Pharynx: No oropharyngeal exudate or posterior oropharyngeal erythema.  Eyes:     Conjunctiva/sclera: Conjunctivae normal.     Pupils: Pupils are equal, round, and reactive to light.  Cardiovascular:     Rate and Rhythm: Normal rate and regular rhythm.     Pulses: Normal pulses.     Heart sounds: No murmur heard.    No gallop.  Pulmonary:     Effort: Pulmonary effort is normal.     Breath sounds: Normal breath sounds. No wheezing or rales.  Abdominal:     Palpations: Abdomen is soft.     Tenderness: There is no abdominal tenderness.  Musculoskeletal:     Cervical back: Neck supple.     Right lower leg: No edema.     Left lower leg: No edema.  Lymphadenopathy:     Cervical: No cervical adenopathy.  Skin:    Findings: No lesion or rash.  Neurological:     General: No focal deficit present.     Mental Status: He is alert and oriented to person, place, and time.  Psychiatric:        Mood and Affect: Mood normal.        Behavior: Behavior normal.            Assessment & Plan:

## 2023-01-20 NOTE — Assessment & Plan Note (Signed)
Healthy Discussed exercise Colon due again in 2027 Will check PSA Recommended updated flu/COVID vaccines in the fall--he isn't excited about these

## 2023-01-20 NOTE — Assessment & Plan Note (Signed)
BP Readings from Last 3 Encounters:  01/20/23 122/84  01/16/22 120/84  12/07/21 118/80   Controlled well on the losartan/hydrochlorothiazide 100/25 Will check labs

## 2023-01-21 ENCOUNTER — Other Ambulatory Visit: Payer: Self-pay | Admitting: Internal Medicine

## 2023-01-21 DIAGNOSIS — R972 Elevated prostate specific antigen [PSA]: Secondary | ICD-10-CM

## 2023-01-21 LAB — COMPREHENSIVE METABOLIC PANEL
ALT: 17 U/L (ref 0–53)
AST: 15 U/L (ref 0–37)
Albumin: 4.3 g/dL (ref 3.5–5.2)
Alkaline Phosphatase: 59 U/L (ref 39–117)
BUN: 12 mg/dL (ref 6–23)
CO2: 31 mEq/L (ref 19–32)
Calcium: 9.8 mg/dL (ref 8.4–10.5)
Chloride: 101 mEq/L (ref 96–112)
Creatinine, Ser: 1.01 mg/dL (ref 0.40–1.50)
GFR: 79.29 mL/min (ref >60.00)
Glucose, Bld: 86 mg/dL (ref 70–99)
Potassium: 4.2 mEq/L (ref 3.5–5.1)
Sodium: 140 mEq/L (ref 135–145)
Total Bilirubin: 0.6 mg/dL (ref 0.2–1.2)
Total Protein: 7 g/dL (ref 6.0–8.3)

## 2023-01-21 LAB — CBC
HCT: 45.1 % (ref 39.0–52.0)
Hemoglobin: 15.1 g/dL (ref 13.0–17.0)
MCHC: 33.6 g/dL (ref 30.0–36.0)
MCV: 87 fl (ref 78.0–100.0)
Platelets: 331 10*3/uL (ref 150.0–400.0)
RBC: 5.19 Mil/uL (ref 4.22–5.81)
RDW: 14.2 % (ref 11.5–15.5)
WBC: 8.6 10*3/uL (ref 4.0–10.5)

## 2023-01-21 LAB — PSA: PSA: 4.89 ng/mL — ABNORMAL HIGH (ref 0.10–4.00)

## 2023-02-12 ENCOUNTER — Encounter: Payer: Self-pay | Admitting: Internal Medicine

## 2023-02-12 MED ORDER — SILDENAFIL CITRATE 20 MG PO TABS: 60.0000 mg | ORAL_TABLET | Freq: Every day | ORAL | 11 refills | Status: AC | PRN

## 2023-04-20 ENCOUNTER — Other Ambulatory Visit: Payer: Self-pay | Admitting: Internal Medicine

## 2023-04-23 ENCOUNTER — Other Ambulatory Visit (INDEPENDENT_AMBULATORY_CARE_PROVIDER_SITE_OTHER): Payer: BC Managed Care – PPO

## 2023-04-23 DIAGNOSIS — R972 Elevated prostate specific antigen [PSA]: Secondary | ICD-10-CM

## 2023-04-24 LAB — PSA, TOTAL AND FREE
PSA, % Free: 15 % — ABNORMAL LOW (ref >25)
PSA, Free: 0.5 ng/mL
PSA, Total: 3.3 ng/mL (ref <=4.0)

## 2023-04-25 ENCOUNTER — Telehealth: Payer: Self-pay | Admitting: Internal Medicine

## 2023-04-25 NOTE — Telephone Encounter (Signed)
Patient is requesting a call back regarding recent labs to check PSA, states he has some concerns and would appreciate a call to discuss this.

## 2023-04-25 NOTE — Telephone Encounter (Signed)
Spoke to pt. He was concerned over the Free % being 15 showing abnormal. I advised him that since Dr Alphonsus Sias said everything was good, I would assume there was nothing to be concerned with. But I would pass this on to Dr Alphonsus Sias. I will call him back if Dr Alphonsus Sias says anything different than the lab note said.

## 2023-04-28 NOTE — Telephone Encounter (Signed)
Called patient and reviewed all information. Patient verbalized understanding. Will call if any further questions.

## 2023-11-29 ENCOUNTER — Other Ambulatory Visit: Payer: Self-pay | Admitting: Internal Medicine

## 2023-12-04 ENCOUNTER — Encounter: Payer: Self-pay | Admitting: Internal Medicine

## 2023-12-04 ENCOUNTER — Other Ambulatory Visit (INDEPENDENT_AMBULATORY_CARE_PROVIDER_SITE_OTHER)

## 2023-12-04 ENCOUNTER — Other Ambulatory Visit

## 2023-12-04 ENCOUNTER — Ambulatory Visit: Payer: Self-pay | Admitting: Internal Medicine

## 2023-12-04 VITALS — BP 138/88 | HR 63 | Ht 75.75 in | Wt 255.0 lb

## 2023-12-04 DIAGNOSIS — R197 Diarrhea, unspecified: Secondary | ICD-10-CM

## 2023-12-04 DIAGNOSIS — Z860101 Personal history of adenomatous and serrated colon polyps: Secondary | ICD-10-CM

## 2023-12-04 DIAGNOSIS — Z800 Family history of malignant neoplasm of digestive organs: Secondary | ICD-10-CM | POA: Diagnosis not present

## 2023-12-04 LAB — COMPREHENSIVE METABOLIC PANEL WITH GFR
ALT: 21 U/L (ref 0–53)
AST: 15 U/L (ref 0–37)
Albumin: 4.3 g/dL (ref 3.5–5.2)
Alkaline Phosphatase: 61 U/L (ref 39–117)
BUN: 16 mg/dL (ref 6–23)
CO2: 29 meq/L (ref 19–32)
Calcium: 9.2 mg/dL (ref 8.4–10.5)
Chloride: 102 meq/L (ref 96–112)
Creatinine, Ser: 0.95 mg/dL (ref 0.40–1.50)
GFR: 84.81 mL/min (ref >60.00)
Glucose, Bld: 94 mg/dL (ref 70–99)
Potassium: 3.8 meq/L (ref 3.5–5.1)
Sodium: 138 meq/L (ref 135–145)
Total Bilirubin: 0.7 mg/dL (ref 0.2–1.2)
Total Protein: 7.3 g/dL (ref 6.0–8.3)

## 2023-12-04 LAB — CBC WITH DIFFERENTIAL/PLATELET
Basophils Absolute: 0.1 10*3/uL (ref 0.0–0.1)
Basophils Relative: 1 % (ref 0.0–3.0)
Eosinophils Absolute: 0.1 10*3/uL (ref 0.0–0.7)
Eosinophils Relative: 1 % (ref 0.0–5.0)
HCT: 45.6 % (ref 39.0–52.0)
Hemoglobin: 15.6 g/dL (ref 13.0–17.0)
Lymphocytes Relative: 34.6 % (ref 12.0–46.0)
Lymphs Abs: 2.6 10*3/uL (ref 0.7–4.0)
MCHC: 34.2 g/dL (ref 30.0–36.0)
MCV: 86.4 fl (ref 78.0–100.0)
Monocytes Absolute: 0.5 10*3/uL (ref 0.1–1.0)
Monocytes Relative: 7.2 % (ref 3.0–12.0)
Neutro Abs: 4.3 10*3/uL (ref 1.4–7.7)
Neutrophils Relative %: 56.2 % (ref 43.0–77.0)
Platelets: 261 10*3/uL (ref 150.0–400.0)
RBC: 5.28 Mil/uL (ref 4.22–5.81)
RDW: 13.7 % (ref 11.5–15.5)
WBC: 7.6 10*3/uL (ref 4.0–10.5)

## 2023-12-04 LAB — C-REACTIVE PROTEIN: CRP: <1 mg/dL (ref 0.5–20.0)

## 2023-12-04 LAB — SEDIMENTATION RATE: Sed Rate: 11 mm/h (ref 0–20)

## 2023-12-04 NOTE — Progress Notes (Signed)
 Alex Mueller 63 y.o. 07/08/1961 [id number]  Assessment & Plan:   Encounter Diagnoses  Name Primary?   Diarrhea, unspecified type Yes   Hx of adenomatous colonic polyps    Family history of colon cancer-father 70    3-week history of diarrhea acute onset sudden change.  Some urge incontinence.  Heme-negative today, very loose scanty stool in rectum.  Occurred after a camping trip though he did not drink water when he was there.  I did ask about tick bites he does go outdoors a lot and gets those intermittently he does not recall any recent ones.  No new medication no diet changes.  Plan for gastrointestinal infection workup (GI pathogen panel, C. difficile, fecal calprotectin) and labs (CBC, CMET, sed rate, CRP).  Loperamide 2 bid after studies completed, for symptom control  Plans pending results of the evaluation.  If he has persistent problems and that workup is negative most likely proceed to colonoscopy.  Keep in mind the possibility of alpha gal allergy.  CC: Helaine Llanos, MD   Subjective:  Patient consented to the use of artificial intelligence scribe  Chief Complaint: Diarrhea  HPI Alex Mueller is a 63 year old male who presents with changes in bowel movements over the past three weeks.  For the past three weeks, he has experienced frequent, small bowel movements throughout the day, described as small, snake-like stools, accompanied by excessive gas and light to brown watery stools. He has three to four bowel movements per day and sometimes feels the urge to defecate after eating. He has had some episodes of incontinence when unable to reach the bathroom in time.  He recently went on a camping trip with teens, where he consumed only the water he brought with him. He has taken Imodium, provided by his wife, but it did not significantly alleviate his symptoms.  He is currently taking losartan  with no recent changes in medication. Wt Readings from Last 3 Encounters:   12/04/23 255 lb (115.7 kg)  01/20/23 246 lb (111.6 kg)  01/16/22 244 lb (110.7 kg)    Colonoscopy 08/23/2020 - One diminutive polyp in the ascending colon, removed with a cold snare. Resected and retrieved. - A single non-bleeding colonic angiodysplastic lesion. - The examination was otherwise normal on direct and retroflexion views. - Personal history of colonic polyps diminutive adenomas 2006 and 2016. And FHx CRCA father age 29 Polyp with sessile serrated polyp.  Plan for 5-year repeat colonoscopy.  Allergies  Allergen Reactions   Bee Venom Shortness Of Breath   Lisinopril     REACTION: cough   Oxycodone  Nausea And Vomiting   Current Meds  Medication Sig   losartan -hydrochlorothiazide  (HYZAAR) 100-25 MG tablet TAKE 1 TABLET BY MOUTH DAILY.   sildenafil  (REVATIO ) 20 MG tablet Take 3-5 tablets (60-100 mg total) by mouth daily as needed.   Past Medical History:  Diagnosis Date   Angiodysplasia of cecum 08/23/2020   ED (erectile dysfunction)    Hx of adenomatous colonic polyps 12/03/2004   Hyperlipidemia    Hypertension    Past Surgical History:  Procedure Laterality Date   COLONOSCOPY     FOOT SURGERY  07/05/2014   KNEE ARTHROSCOPY     right knee 1988, left knee 1998   LUMBAR DISC SURGERY  10/2014   Social History   Social History Narrative   Retired [employer]  No alcohol tobacco or drug use.  Married to Ernesto Heady, former Conservator, museum/gallery.  family history includes Atrial fibrillation in his mother; Cancer in his father; Colon cancer in his father; Coronary artery disease in his father and paternal uncle; Diabetes in his father; Drug abuse in his maternal grandmother.   Review of Systems As per HPI otherwise negative  Objective:   Physical Exam @BP  (!) 146/94 (BP Location: Left Arm, Patient Position: Sitting, Cuff Size: Large)   Pulse 63   Ht 6' 3.75" (1.924 m) Comment: Height measured wothout shoes  Wt 255 lb (115.7 kg)   BMI 31.24 kg/m  @  General:  NAD Eyes:   anicteric Lungs:  clear Heart::  S1S2 no rubs, murmurs or gallops Abdomen:  soft and nontender, BS+ no organomegaly or mass   Rectal - no mass, loose brown stool that is heme negative  Data Reviewed:   See HPI

## 2023-12-04 NOTE — Patient Instructions (Signed)
 Your provider has requested that you go to the basement level for lab work before leaving today. Press "B" on the elevator. The lab is located at the first door on the left as you exit the elevator.  After you collect the stool samples you can take over the counter Imodium for the diarrhea as follows: Take 2 tablets twice a day.  _______________________________________________________  If your blood pressure at your visit was 140/90 or greater, please contact your primary care physician to follow up on this.  _______________________________________________________  If you are age 17 or older, your body mass index should be between 23-30. Your Body mass index is 31.24 kg/m. If this is out of the aforementioned range listed, please consider follow up with your Primary Care Provider.  If you are age 16 or younger, your body mass index should be between 19-25. Your Body mass index is 31.24 kg/m. If this is out of the aformentioned range listed, please consider follow up with your Primary Care Provider.   ________________________________________________________  The [facility] providers would like to encourage you to use MYCHART to communicate with providers for non-urgent requests or questions.  Due to long hold times on the telephone, sending your provider a message by [facility] may be a faster and more efficient way to get a response.  Please allow 48 business hours for a response.  Please remember that this is for non-urgent requests.  _______________________________________________________  I appreciate the opportunity to care for you. Loy Ruff, MD, [facility]

## 2023-12-05 ENCOUNTER — Ambulatory Visit: Payer: Self-pay | Admitting: Internal Medicine

## 2023-12-05 LAB — CLOSTRIDIUM DIFFICILE TOXIN B, QUALITATIVE, REAL-TIME PCR: Toxigenic C. Difficile by PCR: NOT DETECTED

## 2023-12-06 LAB — GI PROFILE, STOOL, PCR

## 2023-12-07 LAB — CALPROTECTIN, FECAL: Calprotectin, Fecal: 14 ug/g (ref 0–120)

## 2023-12-09 ENCOUNTER — Other Ambulatory Visit: Payer: Self-pay | Admitting: Internal Medicine

## 2023-12-09 DIAGNOSIS — R197 Diarrhea, unspecified: Secondary | ICD-10-CM

## 2023-12-11 ENCOUNTER — Encounter: Payer: Self-pay | Admitting: Internal Medicine

## 2023-12-31 ENCOUNTER — Encounter: Payer: Self-pay | Admitting: Internal Medicine

## 2023-12-31 ENCOUNTER — Telehealth: Payer: Self-pay | Admitting: *Deleted

## 2023-12-31 ENCOUNTER — Ambulatory Visit (AMBULATORY_SURGERY_CENTER): Admitting: *Deleted

## 2023-12-31 VITALS — Ht 76.0 in | Wt 245.0 lb

## 2023-12-31 DIAGNOSIS — Z860100 Personal history of colon polyps, unspecified: Secondary | ICD-10-CM

## 2023-12-31 MED ORDER — SUTAB 1479-225-188 MG PO TABS: ORAL_TABLET | ORAL | 0 refills | Status: DC

## 2023-12-31 NOTE — Progress Notes (Signed)
  Pt's name and DOB verified at the beginning of the pre-visit wit 2 identifiers  Pt denies any difficulty with ambulating,sitting, laying down or rolling side to side  Pt has no issues moving head neck or swallowing  No egg or soy allergy known to patient   HX of PONV  No FH of Malignant Hyperthermia  Pt is not on home 02   Pt is not on blood thinners   Pt denies issues with constipation   Pt is not on dialysis  Pt denise any abnormal heart rhythms   Pt denies any upcoming cardiac testing  Patient's chart reviewed by Rogena Class CNRA prior to pre-visit and patient appropriate for the [facility].  Pre-visit completed and red dot placed by patient's name on their procedure day (on provider's schedule).    Visit by phone  Pt states weight is 249 lb   IInstructions reviewed. Pt given  both [facility] main # and MD on call # prior to instructions.  Pt states understanding of instructions. Instructed pt to review instructions again prior to procedure and call main # given if has questions.. Pt states they will.   Instructed pt on where to find instructions on My Chart.

## 2023-12-31 NOTE — Telephone Encounter (Signed)
 Attempt to reach pt for pre-visit. No answer stated  Unable to receive your call.   Will attempt other number in profile   Second attempt to reach pt for pre-vist unsuccessful.  Alex Mueller Message from [employer] for home phone as well.

## 2024-01-18 NOTE — Progress Notes (Unsigned)
 [facility] History and Physical   Primary Care Physician:  Jimmy Charlie FERNS, MD   Reason for Procedure:  Change in bowel habits  Plan:    Colonoscopy     HPI: Alex Mueller is a 63 y.o. male with a history of colon polyps and recent problems with change in bowel habits and negative infectious workup.  Seen in May with a several week history of sudden change with diarrhea problems.  Inflammatory markers, GI pathogen panel fecal calprotectin were all negative.  No anemia.  Colonoscopy scheduled to evaluate change in bowel habits.  Father had colon cancer at age 82. Polyp history: 2006 - small adenoma 2011 no polyps 04/05/2015 4 mm sigmoid polyp - adenoma - 08/23/2020 diminutive ascending polyp ssp recall 2027 (FHx also)   Past Medical History:  Diagnosis Date   Angiodysplasia of cecum 08/23/2020   ED (erectile dysfunction)    Hx of adenomatous colonic polyps 12/03/2004   Hyperlipidemia    Hypertension     Past Surgical History:  Procedure Laterality Date   COLONOSCOPY     FOOT SURGERY  07/05/2014   KNEE ARTHROSCOPY     right knee 1988, left knee 1998   LUMBAR DISC SURGERY  10/2014    Prior to Admission medications   Medication Sig Start Date End Date Taking? Authorizing Provider  EPINEPHrine  0.3 mg/0.3 mL IJ SOAJ injection INJECT 0.3 MLS (0.3 MG TOTAL) INTO THE MUSCLE ONCE. 01/20/23   Jimmy Charlie FERNS, MD  losartan -hydrochlorothiazide  (HYZAAR) 100-25 MG tablet TAKE 1 TABLET BY MOUTH DAILY. 12/01/23   Jimmy Charlie FERNS, MD  sildenafil  (REVATIO ) 20 MG tablet Take 3-5 tablets (60-100 mg total) by mouth daily as needed. 02/12/23   Letvak, Richard I, MD  Sodium Sulfate-Mag Sulfate-KCl (SUTAB ) [phone] MG TABS Use as directed for colonoscopy. MANUFACTURER CODES!! BIN: [id number] PCN: CN GROUP: [id number] MEMBER ID: [id number];MLW AS SECONDARY INSURANCE ;NO PRIOR AUTHORIZATION 12/31/23   Avram Lupita BRAVO, MD    Current Outpatient Medications  Medication Sig Dispense Refill    EPINEPHrine  0.3 mg/0.3 mL IJ SOAJ injection INJECT 0.3 MLS (0.3 MG TOTAL) INTO THE MUSCLE ONCE. 2 each 1   losartan -hydrochlorothiazide  (HYZAAR) 100-25 MG tablet TAKE 1 TABLET BY MOUTH DAILY. 90 tablet 3   sildenafil  (REVATIO ) 20 MG tablet Take 3-5 tablets (60-100 mg total) by mouth daily as needed. 50 tablet 11   Sodium Sulfate-Mag Sulfate-KCl (SUTAB ) [phone] MG TABS Use as directed for colonoscopy. MANUFACTURER CODES!! BIN: [id number] PCN: CN GROUP: [id number] MEMBER ID: [id number];MLW AS SECONDARY INSURANCE ;NO PRIOR AUTHORIZATION 24 tablet 0   No current facility-administered medications for this visit.    Allergies as of 01/19/2024 - Review Complete 12/31/2023  Allergen Reaction Noted   Bee venom Shortness Of Breath 08/07/2014   Lisinopril  03/12/2007   Oxycodone  Nausea And Vomiting 08/08/2014    Family History  Problem Relation Age of Onset   Atrial fibrillation Mother    Coronary artery disease Father    Cancer Father        bladder and colon cancer   Diabetes Father    Colon cancer Father    Coronary artery disease Paternal Uncle    Colon cancer Maternal Grandmother    Drug abuse Maternal Grandmother    Hypertension Neg Hx    Esophageal cancer Neg Hx    Rectal cancer Neg Hx    Stomach cancer Neg Hx    Colon polyps Neg Hx     Social History   Socioeconomic History  Marital status: Married    Spouse name: Not on file   Number of children: 2   Years of education: Not on file   Highest education level: Not on file  Occupational History   Occupation: maintenance/plumbing    Employer: [employer]    Comment: Retired--now part time as Surveyor, minerals  Tobacco Use   Smoking status: Never    Passive exposure: Never   Smokeless tobacco: Never  Vaping Use   Vaping status: Never Used  Substance and Sexual Activity   Alcohol use: No    Alcohol/week: 0.0 standard drinks of alcohol   Drug use: No   Sexual activity: Yes  Other Topics Concern   Not on file   Social History Narrative   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  Physical Exam: Vital signs There were no vitals taken for this visit.  General:   Alert,  Well-developed, well-nourished, pleasant and cooperative in NAD Lungs:  Clear throughout to auscultation.   Heart:  Regular rate and rhythm; no murmurs, clicks, rubs,  or gallops. Abdomen:  Soft, nontender and nondistended. Normal bowel sounds.   Neuro/Psych:  Alert and cooperative. Normal mood and affect. A and O x 3   @Leontae Bostock  CHARLENA Commander, MD, [facility] [phone] (pager) 01/18/2024 4:17 PM@

## 2024-01-19 ENCOUNTER — Encounter: Payer: Self-pay | Admitting: Internal Medicine

## 2024-01-19 ENCOUNTER — Ambulatory Visit: Admitting: Internal Medicine

## 2024-01-19 VITALS — BP 153/94 | HR 77 | Temp 98.8°F | Resp 18 | Ht 76.0 in | Wt 245.0 lb

## 2024-01-19 DIAGNOSIS — Z860100 Personal history of colon polyps, unspecified: Secondary | ICD-10-CM

## 2024-01-19 DIAGNOSIS — Z800 Family history of malignant neoplasm of digestive organs: Secondary | ICD-10-CM

## 2024-01-19 DIAGNOSIS — R194 Change in bowel habit: Secondary | ICD-10-CM

## 2024-01-19 DIAGNOSIS — Z860101 Personal history of adenomatous and serrated colon polyps: Secondary | ICD-10-CM | POA: Diagnosis not present

## 2024-01-19 MED ORDER — SODIUM CHLORIDE 0.9 % IV SOLN: 500.0000 mL | Freq: Once | INTRAVENOUS | Status: DC

## 2024-01-19 NOTE — Op Note (Addendum)
 [facility] Patient Name: Alex Mueller Procedure Date: 01/19/2024 2:16 PM MRN: [id number] Endoscopist: Lupita FORBES Commander , MD, [id number] Age: 63 Referring MD:  Date of Birth: 1960/11/10 Gender: Male Account #: [id number] Procedure:                Colonoscopy Indications:              Diarrhea, Change in bowel habits Medicines:                Monitored Anesthesia Care Procedure:                Pre-Anesthesia Assessment:                           - Prior to the procedure, a History and Physical                            was performed, and patient medications and                            allergies were reviewed. The patient's tolerance of                            previous anesthesia was also reviewed. The risks                            and benefits of the procedure and the sedation                            options and risks were discussed with the patient.                            All questions were answered, and informed consent                            was obtained. Prior Anticoagulants: The patient has                            taken no anticoagulant or antiplatelet agents. ASA                            Grade Assessment: II - A patient with mild systemic                            disease. After reviewing the risks and benefits,                            the patient was deemed in satisfactory condition to                            undergo the procedure.                           After obtaining informed consent, the colonoscope  was passed under direct vision. Throughout the                            procedure, the patient's blood pressure, pulse, and                            oxygen saturations were monitored continuously. The                            Olympus CF-HQ190L ([id number]) Colonoscope was                            introduced through the anus and advanced to the the                            cecum, identified by appendiceal  orifice and                            ileocecal valve. The colonoscopy was performed                            without difficulty. The patient tolerated the                            procedure well. The quality of the bowel                            preparation was good. The terminal ileum, ileocecal                            valve, appendiceal orifice, and rectum were                            photographed. The bowel preparation used was SUTAB                             via split dose instruction. Scope In: Scope Out: Findings:                 The perianal and digital rectal examinations were                            normal.                           The terminal ileum appeared normal.                           The entire examined colon appeared normal on direct                            and retroflexion views.                           Biopsies for histology were taken with a cold  forceps from the entire colon for evaluation of                            microscopic colitis. Complications:            No immediate complications. Estimated Blood Loss:     Estimated blood loss was minimal. Impression:               - The examined portion of the ileum was normal.                           - The entire examined colon is normal on direct and                            retroflexion views.                           - Biopsies were taken with a cold forceps from the                            entire colon for evaluation of microscopic colitis.                           - Personal history of colonic polyps.                           2006 - small adenoma                           2011 no polyps                           04/05/2015 4 mm sigmoid polyp - adenoma -                           08/23/2020 diminutive ascending polyp ssp                           Family Hx CRCA father dx age 59 Recommendation:           - Patient has a contact number available for                             emergencies. The signs and symptoms of potential                            delayed complications were discussed with the                            patient. Return to normal activities tomorrow.                            Written discharge instructions were provided to the                            patient.                           -  Resume previous diet.                           - Continue present medications.                           - Await pathology results. He does report                            iprovement in sxs (infection w/u - GI pathogen                            panel and C diff PCR - and calprotectin were                            normal) however still having some urge incontinence                           - Repeat colonoscopy in 5 years. Lupita FORBES Commander, MD 01/19/2024 2:28:50 PM This report has been signed electronically.

## 2024-01-19 NOTE — Progress Notes (Signed)
 Pt's states no medical or surgical changes since previsit or office visit.

## 2024-01-19 NOTE — Patient Instructions (Addendum)
 Normal exam today - no signs of inflammation and no polyps or cancer seen.  I did take biopsies to look for microscopic changes and will let you know.  It is good to hear that there has been some improvement.  I appreciate the opportunity to care for you. Alex CHARLENA Commander, MD, [facility]  Resume previous diet Continue present medications Await pathology results Repeat colonoscopy in 5 years  YOU HAD AN ENDOSCOPIC PROCEDURE TODAY AT THE [facility]:   Refer to the procedure report that was given to you for any specific questions about what was found during the examination.  If the procedure report does not answer your questions, please call your gastroenterologist to clarify.  If you requested that your care partner not be given the details of your procedure findings, then the procedure report has been included in a sealed envelope for you to review at your convenience later.  YOU SHOULD EXPECT: Some feelings of bloating in the abdomen. Passage of more gas than usual.  Walking can help get rid of the air that was put into your GI tract during the procedure and reduce the bloating. If you had a lower endoscopy (such as a colonoscopy or flexible sigmoidoscopy) you may notice spotting of blood in your stool or on the toilet paper. If you underwent a bowel prep for your procedure, you may not have a normal bowel movement for a few days.  Please Note:  You might notice some irritation and congestion in your nose or some drainage.  This is from the oxygen used during your procedure.  There is no need for concern and it should clear up in a day or so.  SYMPTOMS TO REPORT IMMEDIATELY:  Following lower endoscopy (colonoscopy or flexible sigmoidoscopy):  Excessive amounts of blood in the stool  Significant tenderness or worsening of abdominal pains  Swelling of the abdomen that is new, acute  Fever of 100F or higher    For urgent or emergent issues, a gastroenterologist can be reached at  any hour by calling [phone]. Do not use MyChart messaging for urgent concerns.    DIET:  We do recommend a small meal at first, but then you may proceed to your regular diet.  Drink plenty of fluids but you should avoid alcoholic beverages for 24 hours.  ACTIVITY:  You should plan to take it easy for the rest of today and you should NOT DRIVE or use heavy machinery until tomorrow (because of the sedation medicines used during the test).    FOLLOW UP: Our staff will call the number listed on your records the next business day following your procedure.  We will call around 7:15- 8:00 am to check on you and address any questions or concerns that you may have regarding the information given to you following your procedure. If we do not reach you, we will leave a message.     If any biopsies were taken you will be contacted by phone or by letter within the next 1-3 weeks.  Please call us  at [phone] if you have not heard about the biopsies in 3 weeks.    SIGNATURES/CONFIDENTIALITY: You and/or your care partner have signed paperwork which will be entered into your electronic medical record.  These signatures attest to the fact that that the information above on your After Visit Summary has been reviewed and is understood.  Full responsibility of the confidentiality of this discharge information lies with you and/or your care-partner.

## 2024-01-19 NOTE — Progress Notes (Signed)
 Report given to PACU, vss

## 2024-01-19 NOTE — Progress Notes (Signed)
 Called to room to assist during endoscopic procedure.  Patient ID and intended procedure confirmed with present staff. Received instructions for my participation in the procedure from the performing physician.

## 2024-01-19 NOTE — Progress Notes (Signed)
 1346 Patient states he experiencing nausea and vomiting with anesthesia.  MD updated and Zofran  4 mg IV given, vss

## 2024-01-20 ENCOUNTER — Telehealth: Payer: Self-pay | Admitting: *Deleted

## 2024-01-20 NOTE — Telephone Encounter (Signed)
  Follow up Call-     01/19/2024    1:14 PM  Call back number  Post procedure Call Back phone  # [phone]  Permission to leave phone message Yes     Patient questions:  Do you have a fever, pain , or abdominal swelling? No. Pain Score  0 *  Have you tolerated food without any problems? Yes.    Have you been able to return to your normal activities? Yes.    Do you have any questions about your discharge instructions: Diet   No. Medications  No. Follow up visit  No.  Do you have questions or concerns about your Care? No.  Actions: * If pain score is 4 or above: No action needed, pain <4.

## 2024-01-21 ENCOUNTER — Ambulatory Visit (INDEPENDENT_AMBULATORY_CARE_PROVIDER_SITE_OTHER): Payer: BC Managed Care – PPO | Admitting: Internal Medicine

## 2024-01-21 ENCOUNTER — Encounter: Payer: Self-pay | Admitting: Internal Medicine

## 2024-01-21 VITALS — BP 112/84 | HR 73 | Temp 97.9°F | Ht 75.75 in | Wt 241.0 lb

## 2024-01-21 DIAGNOSIS — I10 Essential (primary) hypertension: Secondary | ICD-10-CM

## 2024-01-21 DIAGNOSIS — Z0000 Encounter for general adult medical examination without abnormal findings: Secondary | ICD-10-CM | POA: Diagnosis not present

## 2024-01-21 MED ORDER — EPINEPHRINE 0.3 MG/0.3ML IJ SOAJ: INTRAMUSCULAR | 1 refills | Status: DC

## 2024-01-21 NOTE — Progress Notes (Signed)
 Subjective:    Patient ID: Alex Mueller, male    DOB: [birth year], 63 y.o.   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 2 each 1   losartan -hydrochlorothiazide  (HYZAAR) 100-25 MG tablet TAKE 1 TABLET BY MOUTH DAILY. 90 tablet 3   sildenafil  (REVATIO ) 20 MG tablet Take 3-5 tablets (60-100 mg total) by mouth daily as needed. 50 tablet 11   No current facility-administered medications on file prior to visit.    Allergies  Allergen Reactions   Bee Venom Shortness Of Breath   Oxycodone  Nausea And Vomiting   Lisinopril Other (See Comments)    REACTION: cough    Past Medical History:  Diagnosis Date   Angiodysplasia of cecum 08/23/2020   ED (erectile dysfunction)    Hx of adenomatous colonic polyps 12/03/2004   Hyperlipidemia    Hypertension     Past Surgical History:  Procedure Laterality Date   COLONOSCOPY     FOOT SURGERY  07/05/2014   KNEE ARTHROSCOPY     right knee 1988, left knee 1998   LUMBAR DISC SURGERY  10/2014    Family History  Problem Relation Age of Onset   Atrial fibrillation Mother    Coronary artery disease Father    Cancer Father        bladder and colon cancer   Diabetes Father    Colon cancer Father    Coronary artery disease Paternal Uncle    Colon cancer Maternal Grandmother    Drug abuse Maternal Grandmother    Hypertension Neg Hx    Esophageal cancer Neg Hx    Rectal cancer Neg Hx    Stomach cancer Neg Hx    Colon polyps Neg Hx     Social History   Socioeconomic History   Marital status: Married    Spouse name: Not on file   Number of children: 2    Years of education: Not on file   Highest education level: Not on file  Occupational History   Occupation: Production manager: [employer]    Comment: Retired--now part time as Surveyor, minerals  Tobacco Use   Smoking status: Never    Passive exposure: Never   Smokeless tobacco: Never  Vaping Use   Vaping status: Never Used  Substance and Sexual Activity   Alcohol use: No    Alcohol/week: 0.0 standard drinks of alcohol   Drug use: No   Sexual activity: Yes  Other Topics Concern   Not on file  Social History Narrative   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       Wears seat belt  HENT:  Negative for dental problem, hearing loss and tinnitus.  Keeps up with dentist  Eyes:  Negative for visual disturbance.       No diplopia or unilateral vision loss  Respiratory:  Negative for cough, chest tightness and shortness of breath.   Cardiovascular:  Negative for chest pain, palpitations and leg swelling.  Gastrointestinal:        Bowels are variable--now more loose and even some incontinence Discussed trying fiber or even rare imodium before going out No heartburn  Endocrine: Negative for polydipsia and polyuria.  Genitourinary:  Negative for difficulty urinating and urgency.       No sexual problems  Musculoskeletal:  Negative for arthralgias, back pain and joint swelling.  Skin:  Negative for rash.       Does see derm yearly  Allergic/Immunologic: Negative for environmental allergies and immunocompromised state.  Neurological:  Negative for dizziness, syncope, light-headedness and headaches.   Hematological:  Negative for adenopathy. Does not bruise/bleed easily.  Psychiatric/Behavioral:  Negative for dysphoric mood and sleep disturbance. The patient is not nervous/anxious.        Objective:   Physical Exam Constitutional:      Appearance: Normal appearance.  HENT:     Mouth/Throat:     Pharynx: No oropharyngeal exudate or posterior oropharyngeal erythema.  Eyes:     Conjunctiva/sclera: Conjunctivae normal.     Pupils: Pupils are equal, round, and reactive to light.  Cardiovascular:     Rate and Rhythm: Normal rate and regular rhythm.     Pulses: Normal pulses.     Heart sounds: No murmur heard.    No gallop.  Pulmonary:     Effort: Pulmonary effort is normal.     Breath sounds: Normal breath sounds. No wheezing or rales.  Abdominal:     Palpations: Abdomen is soft.     Tenderness: There is no abdominal tenderness.  Musculoskeletal:     Cervical back: Neck supple.     Right lower leg: No edema.     Left lower leg: No edema.  Lymphadenopathy:     Cervical: No cervical adenopathy.  Skin:    Findings: No lesion or rash.  Neurological:     General: No focal deficit present.     Mental Status: He is alert and oriented to person, place, and time.  Psychiatric:        Mood and Affect: Mood normal.        Behavior: Behavior normal.            Assessment & Plan:

## 2024-01-21 NOTE — Assessment & Plan Note (Signed)
 BP Readings from Last 3 Encounters:  01/21/24 112/84  01/19/24 (!) 153/94  12/04/23 138/88   Controlled with the losartan /hydrochlorothiazide  100/25

## 2024-01-21 NOTE — Assessment & Plan Note (Signed)
 Healthy Stays fit Colon due again 2030 Will plan to check PSA again next year Still prefers no flu/COVID vaccines

## 2024-01-22 ENCOUNTER — Ambulatory Visit: Payer: Self-pay | Admitting: Internal Medicine

## 2024-01-22 ENCOUNTER — Telehealth: Payer: Self-pay | Admitting: Internal Medicine

## 2024-01-22 LAB — SURGICAL PATHOLOGY

## 2024-01-22 NOTE — Telephone Encounter (Signed)
 Random colon biopsies from recent colonoscopy are normal.  The patient reports that he is overall improving with his diarrhea he actually has some normal bowel movements.  He still has had some urge incontinence.  He wants to observe, I have suggested he use Imodium A-D as needed, he can take this prophylactically 2 tablets if he is going somewhere to try to avoid a problem with urge diarrhea and incontinence.  He will contact me if things do not continue to improve.  Working bernadette is that he had some sort of an infection that was not picked up on stool studies and he has a postinfectious IBS.

## 2024-01-28 ENCOUNTER — Telehealth: Payer: Self-pay | Admitting: *Deleted

## 2024-01-28 MED ORDER — EPINEPHRINE 0.3 MG/0.3ML IJ SOAJ: INTRAMUSCULAR | 1 refills | Status: AC

## 2024-01-28 NOTE — Telephone Encounter (Signed)
 Copied from CRM [id number]. Topic: Clinical - Prescription Issue >> Jan 28, 2024  4:13 PM Kevelyn M wrote: Reason for CRM: Patient called and stated that the EPI pen prescription is on back order. Patient needs this sent to [facility] on [address]. Patient is leaving for out of town on Friday. He needs it before he leaves. Call back #[phone]

## 2024-01-28 NOTE — Telephone Encounter (Signed)
 Alex Mueller notified by telephone that Rx for epi-pen has been sent to [facility] as requested.

## 2025-01-21 ENCOUNTER — Encounter: Admitting: Nurse Practitioner
